# Patient Record
Sex: Male | Born: 1935 | Race: White | Hispanic: No | Marital: Married | Smoking: Former smoker
Health system: Southern US, Community
[De-identification: ages and names within clinical notes are randomized; demographics above are authoritative.]

## PROBLEM LIST (undated history)

## (undated) DIAGNOSIS — I2510 Atherosclerotic heart disease of native coronary artery without angina pectoris: Secondary | ICD-10-CM

## (undated) DIAGNOSIS — M1990 Unspecified osteoarthritis, unspecified site: Secondary | ICD-10-CM

## (undated) DIAGNOSIS — G4730 Sleep apnea, unspecified: Secondary | ICD-10-CM

## (undated) DIAGNOSIS — I499 Cardiac arrhythmia, unspecified: Secondary | ICD-10-CM

## (undated) HISTORY — PX: HERNIA REPAIR: SHX51

---

## 1969-04-03 HISTORY — PX: BACK SURGERY: SHX140

## 1978-12-03 HISTORY — PX: EYE SURGERY: SHX253

## 1997-11-21 ENCOUNTER — Observation Stay (HOSPITAL_COMMUNITY): Admission: EM | Admit: 1997-11-21 | Discharge: 1997-11-22 | Payer: Self-pay | Admitting: Emergency Medicine

## 1997-12-01 ENCOUNTER — Emergency Department (HOSPITAL_COMMUNITY): Admission: EM | Admit: 1997-12-01 | Discharge: 1997-12-01 | Payer: Self-pay | Admitting: Emergency Medicine

## 2002-04-02 ENCOUNTER — Encounter: Admission: RE | Admit: 2002-04-02 | Discharge: 2002-04-02 | Payer: Self-pay | Admitting: Orthopedic Surgery

## 2002-04-02 ENCOUNTER — Encounter: Payer: Self-pay | Admitting: Orthopedic Surgery

## 2003-06-12 ENCOUNTER — Ambulatory Visit (HOSPITAL_COMMUNITY): Admission: RE | Admit: 2003-06-12 | Discharge: 2003-06-12 | Payer: Self-pay | Admitting: Cardiology

## 2003-09-04 ENCOUNTER — Ambulatory Visit (HOSPITAL_COMMUNITY): Admission: RE | Admit: 2003-09-04 | Discharge: 2003-09-04 | Payer: Self-pay | Admitting: Cardiology

## 2003-12-01 ENCOUNTER — Ambulatory Visit (HOSPITAL_COMMUNITY): Admission: RE | Admit: 2003-12-01 | Discharge: 2003-12-01 | Payer: Self-pay | Admitting: Cardiology

## 2004-07-26 ENCOUNTER — Encounter (INDEPENDENT_AMBULATORY_CARE_PROVIDER_SITE_OTHER): Payer: Self-pay | Admitting: *Deleted

## 2004-07-26 ENCOUNTER — Ambulatory Visit (HOSPITAL_COMMUNITY): Admission: RE | Admit: 2004-07-26 | Discharge: 2004-07-26 | Payer: Self-pay | Admitting: Gastroenterology

## 2007-07-12 ENCOUNTER — Encounter: Admission: RE | Admit: 2007-07-12 | Discharge: 2007-07-12 | Payer: Self-pay | Admitting: Internal Medicine

## 2010-08-19 NOTE — Op Note (Signed)
[name], [name]                       ACCOUNT NO.:  [id number]   MEDICAL RECORD NO.:  [id number]                   PATIENT TYPE:  OIB   LOCATION:  2899                                 FACILITY:  [facility]   PHYSICIAN:  Francisca December, M.D.               DATE OF BIRTH:  Oct 09, 1932   DATE OF PROCEDURE:  09/04/2003  DATE OF DISCHARGE:  09/04/2003                                 OPERATIVE REPORT   PROCEDURE PERFORMED:  Direct current elective cardioversion.   INDICATIONS FOR PROCEDURE:  Travis May is a 76 year old man with  atrial fibrillation thought secondary to sleep apnea.  This has been  recently diagnosed.  He has been on CPAP now for approximately six weeks.  He has already undergone one elective cardioversion that was initially  successful but subsequently, the patient reverted to atrial fibrillation.  He is brought back for a repeat procedure at this time after prolonged  treatment with Flecainide and establishing adequate therapy for his sleep  apnea.   PROCEDURE NOTE:  The patient underwent a direct current cardioversion  following the IV administration of 200 mg IV Pentothal administered by Dr.  Gypsy Balsam of the anesthesia department.  After establishing deep anesthesia and  while monitoring heart rate, blood pressure, O2 saturation, and ECG, the  patient received a single transthoracic biphasic dose of energy at 150  joules synchronized.  This resulted in prompt return to sinus rhythm.   IMPRESSION:  Successful elective cardioversion of atrial fibrillation to  sinus rhythm.   PLAN:  The patient's INR was only 1.7 two days ago.  He has not had a recent  PT/INR since Aug 18, 2003.  He has been on 2.5, 2.5, 5 mg of Warfarin in the  recent past.  Will increase his Warfarin to 5 mg p.o. daily and check a  PT/INR in three days.  Will continue Flecainide at 50 mg p.o. b.i.d.  Plan  on seeing the patient in 2-3 weeks in the office and will discontinue  Warfarin if he  maintains sinus rhythm for 4-6 weeks.                                               Francisca December, M.D.    JHE/MEDQ  D:  09/04/2003  T:  09/04/2003  Job:  [id number]   cc:   Theressa Millard, M.D.  [address] [place]  [place] [address]  Fax: [phone]

## 2010-08-19 NOTE — Op Note (Signed)
Travis May, Travis May                       ACCOUNT NO.:  [id number]   MEDICAL RECORD NO.:  [id number]                   PATIENT TYPE:  OIB   LOCATION:  2858                                 FACILITY:  [facility]   PHYSICIAN:  Francisca December, M.D.               DATE OF BIRTH:  [birth year]   DATE OF PROCEDURE:  06/12/2003  DATE OF DISCHARGE:                                 OPERATIVE REPORT   PROCEDURE:  Elective cardioversion.   INDICATIONS:  Mr. Umstead is a 77 year old man with the recent onset of  atrial fibrillation.  No clear structural heart disease has been identified.  An exercise Cardiolite was within normal limits.  Left ventricular function  is normal.  This procedure is undertaken to restore sinus rhythm.  He has  been anticoagulated now for six weeks therapeutically and has been initiated  on p.o. flecainide 50 mg p.o. b.i.d.   DESCRIPTION OF PROCEDURE:  While monitoring heart rate, blood pressure, O2  saturation, and EKG, and under the supervision of Dr. Reuel Boom __________,  anesthesia department, the patient was administered 200 mg of IV penthothal.  Following establishment of deep anesthesia, the patient received a 150 joule  biphasic synchronized dose of transthoracic energy which resulted in prompt  return to sinus rhythm.   The patient is currently awakening with no evidence of untoward sequelae.  An EKG is pending.   IMPRESSION:  Successful elective cardioversion of atrial fibrillation to  sinus rhythm.   PLAN:  Will continue warfarin times four weeks and flecainide indefinitely.  Plan for office visit followup in two to three weeks.                                               Francisca December, M.D.    JHE/MEDQ  D:  06/12/2003  T:  06/14/2003  Job:  [id number]   cc:   Ike Bene, M.D.  [address]  [place]  [place] [address]  Fax: [phone]

## 2010-08-19 NOTE — Op Note (Signed)
NAME:  Travis May, Travis May                     ACCOUNT NO.:  [id number]   MEDICAL RECORD NO.:  [id number]                   PATIENT TYPE:  OIB   LOCATION:  2899                                 FACILITY:  [facility]   PHYSICIAN:  Francisca December, M.D.               DATE OF BIRTH:  1932/12/12   DATE OF PROCEDURE:  12/01/2003  DATE OF DISCHARGE:  12/01/2003                                 OPERATIVE REPORT   PROCEDURE PERFORMED:  Direct current cardioversion.   INDICATIONS:  Mr. Wojnarowski is a 77 year old man with apparent lone atrial  fibrillation.  He has undergone cardioversion twice in March and June of  this year with subsequent recurrence of atrial fibrillation.  He is  minimally symptomatic.  He has been loaded now on p.o. amiodarone and has  greater than 12 g of drug administered over the past four to six weeks.  He  is to undergo his third and last electrocardioversion at this time in an  attempt to maintain sinus rhythm and avoid long-term warfarin therapy.   ANESTHESIA:  By Dr. Felicie Morn, 180 mg of Pentothal administered.   DESCRIPTION OF PROCEDURE:  While monitoring heart rate, blood pressure, O2  saturation, and ECG, and after deep anesthesia obtained by Dr. Katrinka Blazing of the  anesthesia department, the patient was received a single transthoracic dose  of direct current energy, 150 joules biphasic synchronized.  This resulted  in primary return of sinus rhythm.   The patient is currently awakening without evidence of sequelae.   IMPRESSION:  Successful elective cardioversion, atrial fibrillation to sinus  rhythm.   PLAN:  1.  Continue amiodarone 400 mg p.o. daily until next office visit.  2.  Continue warfarin with therapeutic INR of 2-3 for another four weeks.  3.  Will decrease dose of amiodarone at time of his next office which should      be three to four weeks from now.                                               Francisca December, M.D.    JHE/MEDQ  D:  12/01/2003   T:  12/01/2003  Job:  [id number]

## 2012-07-09 ENCOUNTER — Other Ambulatory Visit: Payer: Self-pay | Admitting: Gastroenterology

## 2012-08-12 ENCOUNTER — Encounter (HOSPITAL_COMMUNITY): Payer: Self-pay | Admitting: *Deleted

## 2012-08-20 ENCOUNTER — Encounter (HOSPITAL_COMMUNITY): Payer: Self-pay | Admitting: Pharmacy Technician

## 2012-08-22 NOTE — Progress Notes (Signed)
lov note dr Jocelyn Lamer cardiology 08-20-2012 on chart ekg 08-20-2012 dr Jocelyn Lamer on chart

## 2012-09-03 ENCOUNTER — Encounter (HOSPITAL_COMMUNITY): Payer: Self-pay | Admitting: Anesthesiology

## 2012-09-03 ENCOUNTER — Encounter (HOSPITAL_COMMUNITY): Payer: Self-pay | Admitting: *Deleted

## 2012-09-03 ENCOUNTER — Ambulatory Visit (HOSPITAL_COMMUNITY): Payer: Medicare Other | Admitting: Anesthesiology

## 2012-09-03 ENCOUNTER — Ambulatory Visit (HOSPITAL_COMMUNITY)
Admission: RE | Admit: 2012-09-03 | Discharge: 2012-09-03 | Disposition: A | Discharge status: Home / Self Care | Payer: Medicare Other | Source: Ambulatory Visit | Attending: Gastroenterology | Admitting: Gastroenterology

## 2012-09-03 ENCOUNTER — Encounter (HOSPITAL_COMMUNITY)
Admission: RE | Disposition: A | Discharge status: Home / Self Care | Payer: Self-pay | Source: Ambulatory Visit | Attending: Gastroenterology

## 2012-09-03 DIAGNOSIS — Z860100 Personal history of colon polyps, unspecified: Secondary | ICD-10-CM | POA: Insufficient documentation

## 2012-09-03 DIAGNOSIS — Z1211 Encounter for screening for malignant neoplasm of colon: Secondary | ICD-10-CM | POA: Insufficient documentation

## 2012-09-03 DIAGNOSIS — I4891 Unspecified atrial fibrillation: Secondary | ICD-10-CM | POA: Insufficient documentation

## 2012-09-03 DIAGNOSIS — Z79899 Other long term (current) drug therapy: Secondary | ICD-10-CM | POA: Insufficient documentation

## 2012-09-03 DIAGNOSIS — G4733 Obstructive sleep apnea (adult) (pediatric): Secondary | ICD-10-CM | POA: Insufficient documentation

## 2012-09-03 DIAGNOSIS — D126 Benign neoplasm of colon, unspecified: Secondary | ICD-10-CM | POA: Insufficient documentation

## 2012-09-03 HISTORY — PX: COLONOSCOPY WITH PROPOFOL: SHX5780

## 2012-09-03 HISTORY — DX: Sleep apnea, unspecified: G47.30

## 2012-09-03 HISTORY — DX: Atherosclerotic heart disease of native coronary artery without angina pectoris: I25.10

## 2012-09-03 HISTORY — DX: Unspecified osteoarthritis, unspecified site: M19.90

## 2012-09-03 HISTORY — DX: Cardiac arrhythmia, unspecified: I49.9

## 2012-09-03 SURGERY — COLONOSCOPY WITH PROPOFOL
Anesthesia: Monitor Anesthesia Care

## 2012-09-03 MED ORDER — PROMETHAZINE HCL 25 MG/ML IJ SOLN: 6.2500 mg | INTRAMUSCULAR | Status: DC | PRN

## 2012-09-03 MED ORDER — MIDAZOLAM HCL 5 MG/5ML IJ SOLN
INTRAMUSCULAR | Status: DC | PRN
  Administered 2012-09-03: 1 mg via INTRAVENOUS

## 2012-09-03 MED ORDER — KETAMINE HCL 10 MG/ML IJ SOLN
INTRAMUSCULAR | Status: DC | PRN
  Administered 2012-09-03: 12 mg via INTRAVENOUS
  Administered 2012-09-03: 13 mg via INTRAVENOUS

## 2012-09-03 MED ORDER — SODIUM CHLORIDE 0.9 % IV SOLN: INTRAVENOUS | Status: DC

## 2012-09-03 MED ORDER — LACTATED RINGERS IV SOLN
INTRAVENOUS | Status: DC
  Administered 2012-09-03: 1000 mL via INTRAVENOUS

## 2012-09-03 MED ORDER — PROPOFOL INFUSION 10 MG/ML OPTIME
INTRAVENOUS | Status: DC | PRN
  Administered 2012-09-03: 75 ug/kg/min via INTRAVENOUS

## 2012-09-03 SURGICAL SUPPLY — 21 items

## 2012-09-03 NOTE — H&P (Signed)
  Procedure: Surveillance colonoscopy  History: The patient is a 79 year old male born 26-Jun-1932. The patient chronically takes Coumadin to treat chronic atrial fibrillation and has obstructive sleep apnea syndrome.  In 2006, adenomatous colon polyps were removed colonoscopically. In 2009, the patient underwent a normal surveillance colonoscopy.  The patient is scheduled to undergo a repeat surveillance colonoscopy today.  Chronic medications: Coumadin. Diltiazem. Simvastatin.  Past medical and surgical history: Herniorrhaphies. Lumbar laminectomy. Left fifth finger fracture surgery. Basal cell skin cancer removed from the forehead. Situational adjustment reaction. Allergic rhinitis. Hypercholesterolemia. Chronic atrial fibrillation. Obstructive sleep apnea syndrome.  Habits: The patient quit smoking cigarettes in 1965.  Medication allergies: Sulfa  Exam: The patient is alert and lying comfortably on the endoscopy stretcher. Abdomen is soft, flat, and nontender to palpation. Cardiac exam reveals a regular rhythm although the patient carries a diagnosis of chronic atrial fibrillation. Lungs are clear to auscultation.  Plan: Proceed with surveillance colonoscopy using propofol sedation.

## 2012-09-03 NOTE — Op Note (Signed)
Procedure: Surveillance proctocolonoscopy to the descending colon.  Endoscopist: Danise Edge  Premedication: Propofol administered by anesthesia  Procedure: The patient was placed in the left lateral decubitus position. Anal inspection and digital rectal exam were normal. The Pentax pediatric colonoscope was introduced into the rectum and advanced to approximately the proximal descending colon. Due to left colonic loop formation which could not be controlled by applying external abdominal pressure and repositioning the patient from the left lateral decubitus position to the supine position and finally the right lateral decubitus position a complete colonoscopy could not be performed. Colonic preparation for the exam today was good.  Rectum. Normal. Retroflexed view of the distal rectum could not be performed.  Sigmoid colon. Normal.  Descending colon. From the proximal descending colon a 5 mm sessile polyp was removed with the cold snare.  The splenic flexure, transverse colon, hepatic flexure, ascending colon, cecum, and ileocecal valve were not examined  Assessment:  #1. Incomplete surveillance colonoscopy to the proximal descending colon.  #2. A 5 mm sessile polyp was removed from the descending colon.  Recommendations: Schedule air contrast barium enema in approximately 3 weeks.

## 2012-09-03 NOTE — Anesthesia Postprocedure Evaluation (Signed)
Anesthesia Post Note  Patient: Travis May  Procedure(s) Performed: Procedure(s) (LRB): COLONOSCOPY WITH PROPOFOL (N/A)  Anesthesia type: MAC  Patient location: PACU  Post pain: Pain level controlled  Post assessment: Post-op Vital signs reviewed  Last Vitals:  Filed Vitals:   09/03/12 1203  BP: 117/90  Pulse:   Temp:   Resp: 17    Post vital signs: Reviewed  Level of consciousness: sedated  Complications: No apparent anesthesia complications

## 2012-09-03 NOTE — Transfer of Care (Signed)
Immediate Anesthesia Transfer of Care Note  Patient: Travis May  Procedure(s) Performed: Procedure(s) (LRB): COLONOSCOPY WITH PROPOFOL (N/A)  Patient Location: PACU  Anesthesia Type: MAC  Level of Consciousness: sedated, patient cooperative and responds to stimulaton  Airway & Oxygen Therapy: Patient Spontanous Breathing and Patient connected to face mask oxgen  Post-op Assessment: Report given to PACU RN and Post -op Vital signs reviewed and stable  Post vital signs: Reviewed and stable  Complications: No apparent anesthesia complications

## 2012-09-03 NOTE — Anesthesia Preprocedure Evaluation (Addendum)
Anesthesia Evaluation  Patient identified by MRN, date of birth, ID band Patient awake    Reviewed: Allergy & Precautions, H&P , NPO status , Patient's Chart, lab work & pertinent test results  Airway Mallampati: II TM Distance: >3 FB Neck ROM: Full    Dental  (+) Partial Upper, Partial Lower, Caps and Dental Advisory Given   Pulmonary sleep apnea and Continuous Positive Airway Pressure Ventilation , former smoker,  breath sounds clear to auscultation  Pulmonary exam normal       Cardiovascular + CAD + dysrhythmias Atrial Fibrillation Rhythm:Regular Rate:Normal     Neuro/Psych negative neurological ROS  negative psych ROS   GI/Hepatic negative GI ROS, Neg liver ROS,   Endo/Other  negative endocrine ROS  Renal/GU negative Renal ROS  negative genitourinary   Musculoskeletal negative musculoskeletal ROS (+)   Abdominal   Peds  Hematology negative hematology ROS (+)   Anesthesia Other Findings   Reproductive/Obstetrics                          Anesthesia Physical Anesthesia Plan  ASA: III  Anesthesia Plan: MAC   Post-op Pain Management:    Induction: Intravenous  Airway Management Planned: Simple Face Mask  Additional Equipment:   Intra-op Plan:   Post-operative Plan:   Informed Consent: I have reviewed the patients History and Physical, chart, labs and discussed the procedure including the risks, benefits and alternatives for the proposed anesthesia with the patient or authorized representative who has indicated his/her understanding and acceptance.   Dental advisory given  Plan Discussed with: CRNA  Anesthesia Plan Comments:         Anesthesia Quick Evaluation

## 2012-09-03 NOTE — Preoperative (Signed)
Beta Blockers   Reason not to administer Beta Blockers:Not Applicable 

## 2012-09-04 ENCOUNTER — Encounter (HOSPITAL_COMMUNITY): Payer: Self-pay | Admitting: Gastroenterology

## 2012-09-10 ENCOUNTER — Other Ambulatory Visit: Payer: Self-pay | Admitting: Gastroenterology

## 2012-09-10 DIAGNOSIS — Z86010 Personal history of colonic polyps: Secondary | ICD-10-CM

## 2012-09-19 ENCOUNTER — Ambulatory Visit
Admission: RE | Admit: 2012-09-19 | Discharge: 2012-09-19 | Disposition: A | Discharge status: Home / Self Care | Payer: Medicare Other | Source: Ambulatory Visit | Attending: Gastroenterology | Admitting: Gastroenterology

## 2012-09-19 DIAGNOSIS — Z86010 Personal history of colonic polyps: Secondary | ICD-10-CM

## 2013-01-21 ENCOUNTER — Encounter (INDEPENDENT_AMBULATORY_CARE_PROVIDER_SITE_OTHER): Payer: Self-pay

## 2013-01-21 ENCOUNTER — Ambulatory Visit (INDEPENDENT_AMBULATORY_CARE_PROVIDER_SITE_OTHER): Payer: Medicare Other | Admitting: Pharmacist

## 2013-01-21 DIAGNOSIS — I4891 Unspecified atrial fibrillation: Secondary | ICD-10-CM

## 2013-02-12 ENCOUNTER — Other Ambulatory Visit: Payer: Self-pay | Admitting: Nurse Practitioner

## 2013-02-12 ENCOUNTER — Ambulatory Visit
Admission: RE | Admit: 2013-02-12 | Discharge: 2013-02-12 | Disposition: A | Discharge status: Home / Self Care | Payer: Medicare Other | Source: Ambulatory Visit | Attending: Nurse Practitioner | Admitting: Nurse Practitioner

## 2013-02-12 DIAGNOSIS — R52 Pain, unspecified: Secondary | ICD-10-CM

## 2013-03-04 ENCOUNTER — Encounter (INDEPENDENT_AMBULATORY_CARE_PROVIDER_SITE_OTHER): Payer: Self-pay

## 2013-03-04 ENCOUNTER — Ambulatory Visit (INDEPENDENT_AMBULATORY_CARE_PROVIDER_SITE_OTHER): Payer: Medicare Other | Admitting: *Deleted

## 2013-03-04 DIAGNOSIS — I4891 Unspecified atrial fibrillation: Secondary | ICD-10-CM

## 2013-04-15 ENCOUNTER — Ambulatory Visit (INDEPENDENT_AMBULATORY_CARE_PROVIDER_SITE_OTHER): Payer: Medicare Other | Admitting: *Deleted

## 2013-04-15 DIAGNOSIS — I4891 Unspecified atrial fibrillation: Secondary | ICD-10-CM

## 2013-04-15 LAB — POCT INR: INR: 2.6

## 2013-05-27 ENCOUNTER — Ambulatory Visit (INDEPENDENT_AMBULATORY_CARE_PROVIDER_SITE_OTHER): Payer: Medicare Other | Admitting: Pharmacist

## 2013-05-27 DIAGNOSIS — I4891 Unspecified atrial fibrillation: Secondary | ICD-10-CM

## 2013-05-27 LAB — POCT INR: INR: 1.7

## 2013-06-17 ENCOUNTER — Ambulatory Visit (INDEPENDENT_AMBULATORY_CARE_PROVIDER_SITE_OTHER): Payer: Medicare Other

## 2013-06-17 DIAGNOSIS — I4891 Unspecified atrial fibrillation: Secondary | ICD-10-CM

## 2013-06-17 LAB — POCT INR: INR: 2.5

## 2013-07-09 ENCOUNTER — Encounter: Payer: Self-pay | Admitting: Interventional Cardiology

## 2013-07-15 ENCOUNTER — Ambulatory Visit (INDEPENDENT_AMBULATORY_CARE_PROVIDER_SITE_OTHER): Payer: Medicare Other | Admitting: *Deleted

## 2013-07-15 DIAGNOSIS — I4891 Unspecified atrial fibrillation: Secondary | ICD-10-CM

## 2013-07-15 LAB — POCT INR: INR: 2.7

## 2013-07-28 ENCOUNTER — Other Ambulatory Visit: Payer: Self-pay | Admitting: Interventional Cardiology

## 2013-08-19 ENCOUNTER — Ambulatory Visit: Payer: Medicare Other | Admitting: Interventional Cardiology

## 2013-08-26 ENCOUNTER — Encounter: Payer: Self-pay | Admitting: Interventional Cardiology

## 2013-08-26 ENCOUNTER — Ambulatory Visit (INDEPENDENT_AMBULATORY_CARE_PROVIDER_SITE_OTHER): Payer: Medicare Other | Admitting: Pharmacist Clinician (PhC)/ Clinical Pharmacy Specialist

## 2013-08-26 ENCOUNTER — Encounter (INDEPENDENT_AMBULATORY_CARE_PROVIDER_SITE_OTHER): Payer: Self-pay

## 2013-08-26 ENCOUNTER — Ambulatory Visit (INDEPENDENT_AMBULATORY_CARE_PROVIDER_SITE_OTHER): Payer: Medicare Other | Admitting: Interventional Cardiology

## 2013-08-26 VITALS — BP 120/76 | HR 83 | Ht 71.0 in | Wt 237.5 lb

## 2013-08-26 DIAGNOSIS — E782 Mixed hyperlipidemia: Secondary | ICD-10-CM | POA: Insufficient documentation

## 2013-08-26 DIAGNOSIS — I4891 Unspecified atrial fibrillation: Secondary | ICD-10-CM

## 2013-08-26 DIAGNOSIS — I359 Nonrheumatic aortic valve disorder, unspecified: Secondary | ICD-10-CM | POA: Insufficient documentation

## 2013-08-26 LAB — POCT INR: INR: 2.7

## 2013-08-26 NOTE — Patient Instructions (Signed)
Your physician recommends that you continue on your current medications as directed. Please refer to the Current Medication list given to you today.  Your physician wants you to follow-up in: 1 year with Dr. Varanasi. You will receive a reminder letter in the mail two months in advance. If you don't receive a letter, please call our office to schedule the follow-up appointment.  

## 2013-08-26 NOTE — Progress Notes (Signed)
Patient ID: ORA MCNATT, male   DOB: 10-09-1932, 80 y.o.   MRN: [id number]    [facility], [place], [place]  [address] Phone: [phone] Fax:  [phone]  Date:  08/26/2013   ID:  Tenoch, Mcclure October 17, 1932, MRN [id number]  PCP:  Horton Finer, MD      History of Present Illness: DONTARIO EVETTS is a 80 y.o. male who has chronic AFIb. He exercises regularly, 3x/week. He goes for several hours at a time in the summer. He does 30 minutes on the elliptical; then weights and walks on the track. He also works with a Clinical research associate. No bleeding problems.  Denies : Chest pain.  Dyspnea on exertion.  Nitroglycerin.  Orthopnea.  Palpitations.  Syncope.  Feet Delane Ginger are sore. Limits his walking. No chest pain or SOB with exercise. SOme edema at the end of the day.    Wt Readings from Last 3 Encounters:  08/26/13 237 lb 8 oz (107.729 kg)  09/03/12 235 lb (106.595 kg)  09/03/12 235 lb (106.595 kg)     Past Medical History  Diagnosis Date  . Coronary artery disease   . Dysrhythmia     Atrial Fibrillation  . Sleep apnea     CPAP  . Arthritis     Current Outpatient Prescriptions  Medication Sig Dispense Refill  . b complex vitamins tablet Take 1 tablet by mouth daily.      . cholecalciferol (VITAMIN D) 1000 UNITS tablet Take 1,000 Units by mouth daily before breakfast.      . diltiazem (DILACOR XR) 240 MG 24 hr capsule TAKE 1 CAPSULE BY MOUTH EVERY MORNING ON AN EMPTY STOMACH  30 capsule  1  . diltiazem (TIAZAC) 240 MG 24 hr capsule Take 240 mg by mouth daily. May take early am -2 am or 3 am      . simvastatin (ZOCOR) 10 MG tablet Take 10 mg by mouth daily before breakfast.      . sodium chloride (OCEAN) 0.65 % nasal spray Place 1 spray into the nose daily as needed for congestion.      Marland Kitchen warfarin (COUMADIN) 5 MG tablet Take 5 mg by mouth daily. Takes 1 full tablet on Mondays and Fridays and 1/2 tablet on Tuesday, Wednesday, Thursday, Saturday and  Sunday       No current facility-administered medications for this visit.    Allergies:    Allergies  Allergen Reactions  . Sulfur     whelps    Social History:  The patient  reports that he quit smoking about 50 years ago. He does not have any smokeless tobacco history on file. He reports that he drinks alcohol. He reports that he does not use illicit drugs.   Family History:  The patient's family history is not on file.   ROS:  Please see the history of present illness.  No nausea, vomiting.  No fevers, chills.  No focal weakness.  No dysuria.   All other systems reviewed and negative.   PHYSICAL EXAM: VS:  BP 120/76  Pulse 83  Ht 5\' 11"  (1.803 m)  Wt 237 lb 8 oz (107.729 kg)  BMI 33.14 kg/m2 Well nourished, well developed, in no acute distress HEENT: normal Neck: no JVD, no carotid bruits Cardiac:  normal S1, S2; irregularly irregular Lungs:  clear to auscultation bilaterally, no wheezing, rhonchi or rales Abd: soft, nontender, no hepatomegaly Ext: no edema Skin: warm and dry Neuro:   no  focal abnormalities noted  EKG:  AFib, rate controlled    ASSESSMENT AND PLAN:  Atrial fibrillation  IMAGING: EKG   Harward,Amy 08/20/2012 08:49:07 AM > Willys Salvino,JAY 08/20/2012 09:18:17 AM > AFib, rate controlled.  IMAGING: 2D Echocardiogram (Ordered for 08/2012)-mild AI, mild Aortic sclerosis; normal LV function Notes:  Rate controlled. COumadin for stroke prevention.  2. Anticoagulant monitoring  Notes: INR To be checked Coumadin dose to be adjusted with Pharm.D. Please see his note for details.  3. Hypercholesteremia, pure  Continue Simvastatin Tablet, 10 MG, TAKE 1 TABLET BY MOUTH DAILY ; LDL 119; TG 227- try to improve diet -less fast food.  Consider fish oil 2 gr BID.   4. Aortic valve disorders  Notes: Mild AI on prior echocardiogram. blood pressure controlled.    Signed, Mina Marble, MD, [facility] 08/26/2013 3:03 PM

## 2013-09-26 ENCOUNTER — Other Ambulatory Visit: Payer: Self-pay | Admitting: Interventional Cardiology

## 2013-10-07 ENCOUNTER — Ambulatory Visit (INDEPENDENT_AMBULATORY_CARE_PROVIDER_SITE_OTHER): Payer: Medicare Other | Admitting: *Deleted

## 2013-10-07 DIAGNOSIS — I4891 Unspecified atrial fibrillation: Secondary | ICD-10-CM

## 2013-10-07 LAB — POCT INR: INR: 2

## 2013-11-19 ENCOUNTER — Ambulatory Visit (INDEPENDENT_AMBULATORY_CARE_PROVIDER_SITE_OTHER): Payer: Medicare Other | Admitting: Surgery

## 2013-11-19 DIAGNOSIS — I4891 Unspecified atrial fibrillation: Secondary | ICD-10-CM

## 2013-11-19 LAB — POCT INR: INR: 2.4

## 2013-12-02 ENCOUNTER — Ambulatory Visit: Payer: Medicare Other | Admitting: Podiatry

## 2013-12-09 ENCOUNTER — Encounter: Payer: Self-pay | Admitting: Podiatry

## 2013-12-09 ENCOUNTER — Ambulatory Visit (INDEPENDENT_AMBULATORY_CARE_PROVIDER_SITE_OTHER): Payer: Medicare Other

## 2013-12-09 ENCOUNTER — Ambulatory Visit (INDEPENDENT_AMBULATORY_CARE_PROVIDER_SITE_OTHER): Payer: Medicare Other | Admitting: Podiatry

## 2013-12-09 VITALS — BP 139/81 | HR 81 | Resp 18 | Ht 71.0 in | Wt 235.0 lb

## 2013-12-09 DIAGNOSIS — M19079 Primary osteoarthritis, unspecified ankle and foot: Secondary | ICD-10-CM

## 2013-12-09 DIAGNOSIS — M2040 Other hammer toe(s) (acquired), unspecified foot: Secondary | ICD-10-CM

## 2013-12-09 DIAGNOSIS — M79673 Pain in unspecified foot: Secondary | ICD-10-CM

## 2013-12-09 DIAGNOSIS — M79609 Pain in unspecified limb: Secondary | ICD-10-CM

## 2013-12-09 NOTE — Progress Notes (Signed)
   Subjective:    Patient ID: Travis May, male    DOB: 04-Aug-1932, 80 y.o.   MRN: [id number]  HPI Comments: Trouble walking , my feet just hurt. The right great toe has hurt across the top of it, also across the top of the foot, left foot hurts across the top of . Hurting for 3 or 4 years off and on. Was told to take tylenol by primary doctor   Foot Pain      Review of Systems  HENT:       Sinus problems  Genitourinary:       Increased urination   Musculoskeletal:       Joint pain        Objective:   Physical Exam: I have reviewed his past medical history medications allergies surgeries social history and review of systems. Pulses are strongly palpable bilateral. Normal neurologic sensorium is intact per Semmes-Weinstein monofilament. Deep tendon reflexes are intact bilateral muscle strength is 5 over 5 dorsiflexors plantar flexors inverters everters all intrinsic musculature is intact. Orthopedic evaluation demonstrates pes planus bilateral hammertoe deformities bilateral. Radiographic evaluation demonstrates osteoarthritis of the subtalar joint midtarsal joint of the left foot and it Lisfranc joints and forefoot right. Cutaneous evaluation Mr. is supple while hydrated cutis no erythema edema saline is drainage or odor        Assessment & Plan:  Assessment: Osteoarthritis and pes planus bilateral.  Plan: Scanned for a set of orthotics today.

## 2013-12-31 ENCOUNTER — Ambulatory Visit (INDEPENDENT_AMBULATORY_CARE_PROVIDER_SITE_OTHER): Payer: Medicare Other | Admitting: Pharmacist

## 2013-12-31 DIAGNOSIS — I4891 Unspecified atrial fibrillation: Secondary | ICD-10-CM

## 2013-12-31 LAB — POCT INR: INR: 2.4

## 2014-01-06 ENCOUNTER — Ambulatory Visit: Payer: Medicare Other | Admitting: Podiatry

## 2014-01-06 DIAGNOSIS — M79673 Pain in unspecified foot: Secondary | ICD-10-CM

## 2014-01-06 NOTE — Patient Instructions (Signed)

## 2014-02-13 ENCOUNTER — Ambulatory Visit (INDEPENDENT_AMBULATORY_CARE_PROVIDER_SITE_OTHER): Payer: Medicare Other | Admitting: Pharmacist

## 2014-02-13 DIAGNOSIS — I4891 Unspecified atrial fibrillation: Secondary | ICD-10-CM

## 2014-02-13 LAB — POCT INR: INR: 2.5

## 2014-03-16 ENCOUNTER — Other Ambulatory Visit: Payer: Self-pay | Admitting: Interventional Cardiology

## 2014-03-23 ENCOUNTER — Ambulatory Visit (INDEPENDENT_AMBULATORY_CARE_PROVIDER_SITE_OTHER): Payer: Medicare Other | Admitting: Pharmacist

## 2014-03-23 DIAGNOSIS — I4891 Unspecified atrial fibrillation: Secondary | ICD-10-CM

## 2014-03-23 DIAGNOSIS — Z23 Encounter for immunization: Secondary | ICD-10-CM

## 2014-03-23 LAB — POCT INR: INR: 2.2

## 2014-05-04 ENCOUNTER — Ambulatory Visit (INDEPENDENT_AMBULATORY_CARE_PROVIDER_SITE_OTHER): Payer: Medicare Other | Admitting: *Deleted

## 2014-05-04 DIAGNOSIS — I4891 Unspecified atrial fibrillation: Secondary | ICD-10-CM

## 2014-05-04 LAB — POCT INR: INR: 2.6

## 2014-06-16 ENCOUNTER — Ambulatory Visit (INDEPENDENT_AMBULATORY_CARE_PROVIDER_SITE_OTHER): Payer: Medicare Other | Admitting: *Deleted

## 2014-06-16 DIAGNOSIS — I4891 Unspecified atrial fibrillation: Secondary | ICD-10-CM

## 2014-06-16 LAB — POCT INR: INR: 2.6

## 2014-07-29 ENCOUNTER — Ambulatory Visit (INDEPENDENT_AMBULATORY_CARE_PROVIDER_SITE_OTHER): Payer: Medicare Other | Admitting: *Deleted

## 2014-07-29 DIAGNOSIS — I4891 Unspecified atrial fibrillation: Secondary | ICD-10-CM | POA: Diagnosis not present

## 2014-07-29 LAB — POCT INR: INR: 3.3

## 2014-08-27 ENCOUNTER — Ambulatory Visit (INDEPENDENT_AMBULATORY_CARE_PROVIDER_SITE_OTHER): Payer: Medicare Other | Admitting: *Deleted

## 2014-08-27 ENCOUNTER — Encounter: Payer: Self-pay | Admitting: Interventional Cardiology

## 2014-08-27 ENCOUNTER — Ambulatory Visit (INDEPENDENT_AMBULATORY_CARE_PROVIDER_SITE_OTHER): Payer: Medicare Other | Admitting: Interventional Cardiology

## 2014-08-27 VITALS — BP 130/72 | HR 76 | Ht 71.0 in | Wt 237.0 lb

## 2014-08-27 DIAGNOSIS — D6869 Other thrombophilia: Secondary | ICD-10-CM | POA: Insufficient documentation

## 2014-08-27 DIAGNOSIS — I4891 Unspecified atrial fibrillation: Secondary | ICD-10-CM | POA: Diagnosis not present

## 2014-08-27 DIAGNOSIS — I4819 Other persistent atrial fibrillation: Secondary | ICD-10-CM

## 2014-08-27 DIAGNOSIS — I359 Nonrheumatic aortic valve disorder, unspecified: Secondary | ICD-10-CM | POA: Diagnosis not present

## 2014-08-27 DIAGNOSIS — I481 Persistent atrial fibrillation: Secondary | ICD-10-CM | POA: Diagnosis not present

## 2014-08-27 DIAGNOSIS — Z7901 Long term (current) use of anticoagulants: Secondary | ICD-10-CM

## 2014-08-27 LAB — POCT INR: INR: 2.5

## 2014-08-27 NOTE — Patient Instructions (Signed)
Medication Instructions:  Same-no change  Labwork: None  Testing/Procedures: None  Follow-Up: Your physician wants you to follow-up in: 1 year. You will receive a reminder letter in the mail two months in advance. If you don't receive a letter, please call our office to schedule the follow-up appointment.      

## 2014-08-27 NOTE — Progress Notes (Signed)
Patient ID: Travis May, male   DOB: [birth year], 81 y.o.   MRN: [id number]     Cardiology Office Note   Date:  08/27/2014   ID:  Travis, May [birth year], MRN [id number]  PCP:  Horton Finer, MD    No chief complaint on file. AFib   Wt Readings from Last 3 Encounters:  08/27/14 237 lb (107.502 kg)  12/09/13 235 lb (106.595 kg)  08/26/13 237 lb 8 oz (107.729 kg)       History of Present Illness: Travis May is a 81 y.o. male  who has chronic AFIb. He exercises regularly, 3x/week at the gym. He goes for several hours at a time in the summer. He does 30 minutes on the elliptical; then weights and walks on the track-more eeliptical now than track. He worked with a Clinical research associate, but has not been doing this of late. No bleeding problems.  He gets his BP checked there.  He is told that his BP is good there. Denies : Chest pain.  Dyspnea on exertion.  Orthopnea.  Palpitations.  Syncope.  Feet Delane Ginger are sore. Limits his walking. Wondered if statins were causing this.  No chest pain or SOB with exercise. SOme edema at the end of the day.  He can use a push mower without problems.      Past Medical History  Diagnosis Date  . Coronary artery disease   . Dysrhythmia     Atrial Fibrillation  . Sleep apnea     CPAP  . Arthritis     Past Surgical History  Procedure Laterality Date  . Back surgery  1971    herniated disc  . Eye surgery  1980's    bilateral cataract with lens implants  . Hernia repair  1950,1980    right inguinal x3  . Colonoscopy with propofol N/A 09/03/2012    Procedure: COLONOSCOPY WITH PROPOFOL;  Surgeon: Garlan Fair, MD;  Location: [facility];  Service: Endoscopy;  Laterality: N/A;     Current Outpatient Prescriptions  Medication Sig Dispense Refill  . b complex vitamins tablet Take 1 tablet by mouth daily.    . cholecalciferol (VITAMIN D) 1000 UNITS tablet Take 1,000 Units by mouth daily before breakfast.    . DILT-XR  240 MG 24 hr capsule TAKE ONE CAPSULE BY MOUTH EVERY MORNING ON AN EMPTY STOMACH 30 capsule 5  . diltiazem (TIAZAC) 240 MG 24 hr capsule Take 240 mg by mouth daily. May take early am -2 am or 3 am    . glucosamine-chondroitin 500-400 MG tablet Take 1 tablet by mouth 3 (three) times daily.    . simvastatin (ZOCOR) 10 MG tablet Take 10 mg by mouth daily before breakfast.    . sodium chloride (OCEAN) 0.65 % nasal spray Place 1 spray into the nose daily as needed for congestion.    Marland Kitchen warfarin (COUMADIN) 5 MG tablet Take 5 mg by mouth daily. Takes 1 full tablet on Mondays and Fridays and 1/2 tablet on Tuesday, Wednesday, Thursday, Saturday and Sunday     No current facility-administered medications for this visit.    Allergies:   Sulfur    Social History:  The patient  reports that he quit smoking about 51 years ago. He does not have any smokeless tobacco history on file. He reports that he drinks alcohol. He reports that he does not use illicit drugs.   Family History:  The patient's *family history includes Lung cancer in his father.  ROS:  Please see the history of present illness.   Otherwise, review of systems are positive for joint pain.   All other systems are reviewed and negative.    PHYSICAL EXAM: VS:  BP 130/72 mmHg  Pulse 76  Ht 5\' 11"  (1.803 m)  Wt 237 lb (107.502 kg)  BMI 33.07 kg/m2 , BMI Body mass index is 33.07 kg/(m^2). GEN: Well nourished, well developed, in no acute distress HEENT: normal Neck: no JVD, carotid bruits, or masses Cardiac: Irregularly irregular; no murmurs, rubs, or gallops,no edema  Respiratory:  clear to auscultation bilaterally, normal work of breathing GI: soft, nontender, nondistended, + BS MS: no deformity or atrophy Skin: warm and dry, no rash Neuro:  Strength and sensation are intact Psych: euthymic mood, full affect   EKG:   The ekg ordered today demonstrates AFib, rate controlled   Recent Labs: No results found for requested labs  within last 365 days.   Lipid Panel No results found for: CHOL, TRIG, HDL, CHOLHDL, VLDL, LDLCALC, LDLDIRECT   Other studies Reviewed: Additional studies/ records that were reviewed today with results demonstrating: echo, see below.   ASSESSMENT AND PLAN:  Atrial fibrillation  No bleeding problems on Coumadin.  Checked every 6 weeks.       2D Echocardiogram ( 08/2012)-mild AI, mild Aortic sclerosis; normal LV function Notes: Rate controlled. COumadin for stroke prevention.  2. Anticoagulant monitoring  Notes: INR To be checked Coumadin dose to be adjusted with Pharm.D. Please see his note for details.  INR was 2.5 today. 3. Hypercholesteremia, pure  Continue Simvastatin Tablet, 10 MG, TAKE 1 TABLET BY MOUTH DAILY ; LDL 119; TG 227- try to improve diet -less fast food. Consider fish oil 2 gr BID. Follwed by PMD. 4. Aortic valve disorders  Notes: Mild AI on prior echocardiogram. blood pressure controlled. No dizziness       Stopped working last year because of his feet.  He was a Museum/gallery curator.  Now using orthotics.     Current medicines are reviewed at length with the patient today.  The patient concerns regarding his medicines were addressed.  The following changes have been made:  No change   Labs/ tests ordered today include: INR in 6 weeks No orders of the defined types were placed in this encounter.    Recommend 150 minutes/week of aerobic exercise Low fat, low carb, high fiber diet recommended  Disposition:   FU in 1 year   Teresita Madura., MD  08/27/2014 8:53 AM    [facility] [place], [place], [place]  [address] Phone: [phone]; Fax: [phone]

## 2014-09-16 ENCOUNTER — Other Ambulatory Visit: Payer: Self-pay | Admitting: Interventional Cardiology

## 2014-10-07 ENCOUNTER — Encounter: Payer: Self-pay | Admitting: Interventional Cardiology

## 2014-10-08 ENCOUNTER — Ambulatory Visit (INDEPENDENT_AMBULATORY_CARE_PROVIDER_SITE_OTHER): Payer: Medicare Other | Admitting: Surgery

## 2014-10-08 DIAGNOSIS — I4891 Unspecified atrial fibrillation: Secondary | ICD-10-CM | POA: Diagnosis not present

## 2014-10-08 LAB — POCT INR: INR: 2.4

## 2014-11-18 ENCOUNTER — Ambulatory Visit (INDEPENDENT_AMBULATORY_CARE_PROVIDER_SITE_OTHER): Payer: Medicare Other | Admitting: *Deleted

## 2014-11-18 DIAGNOSIS — I4891 Unspecified atrial fibrillation: Secondary | ICD-10-CM

## 2014-11-18 LAB — POCT INR: INR: 2.8

## 2014-12-30 ENCOUNTER — Ambulatory Visit (INDEPENDENT_AMBULATORY_CARE_PROVIDER_SITE_OTHER): Payer: Medicare Other | Admitting: *Deleted

## 2014-12-30 DIAGNOSIS — I4891 Unspecified atrial fibrillation: Secondary | ICD-10-CM

## 2014-12-30 LAB — POCT INR: INR: 2.2

## 2015-02-07 IMAGING — US US EXTREM LOW*R* LIMITED
1 series · 14 of 22 positions shown · non-contrast
Comparison: None.

CLINICAL DATA: Trauma 1.5 months ago, anterolateral mid thigh
swelling, possible hematoma

EXAM:
RIGHT LOWER EXTREMITY SOFT TISSUE ULTRASOUND LIMITED
TECHNIQUE: Ultrasound examination was performed including evaluation of the
muscles, tendons, joint, and adjacent soft tissues.

[Series 1: us extrem low*right* limited · 0.22mm/px · 14 of 22 slices shown]
[im 1/22]
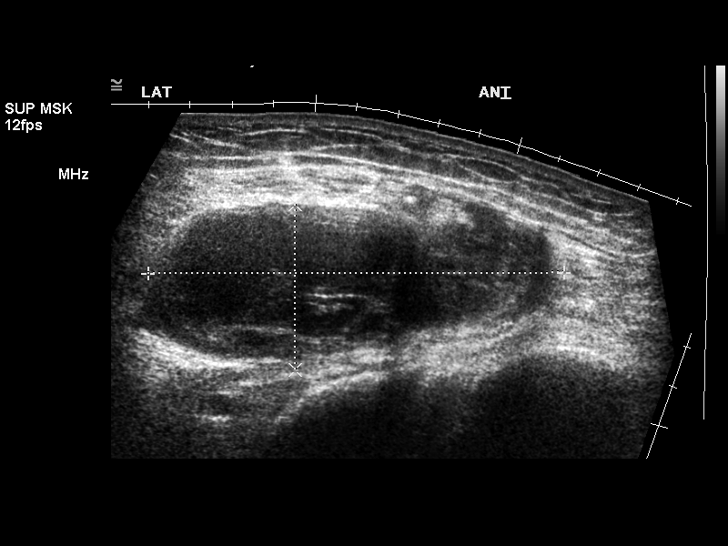
[im 3/22]
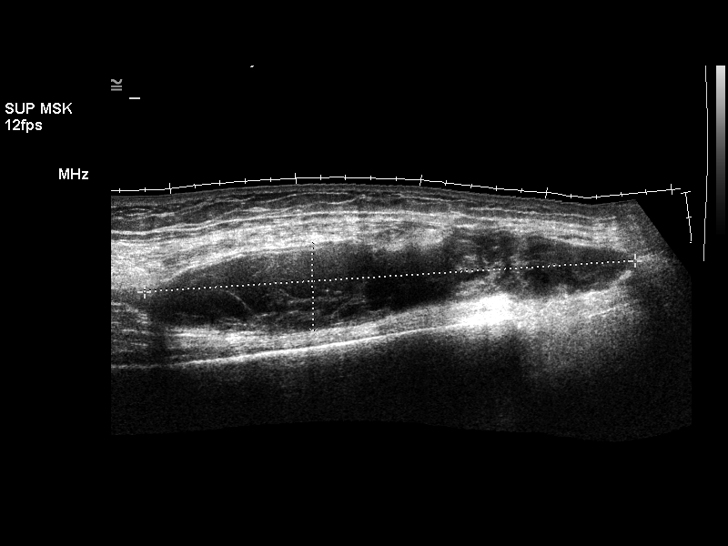
[im 4/22]
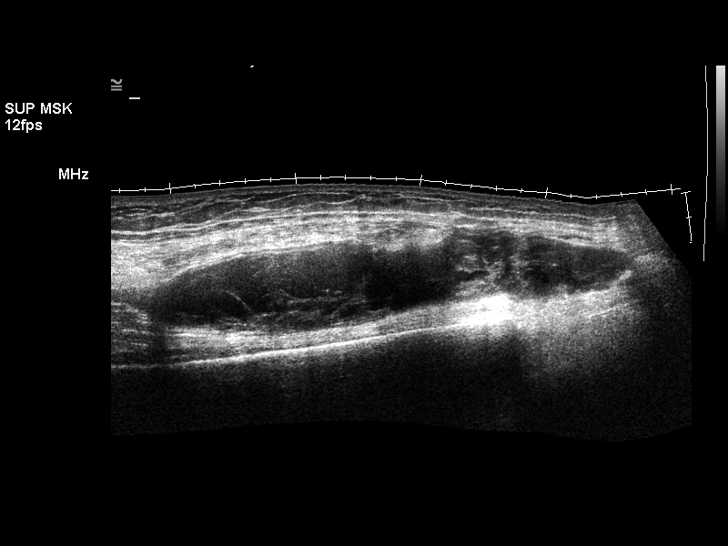
[im 6/22]
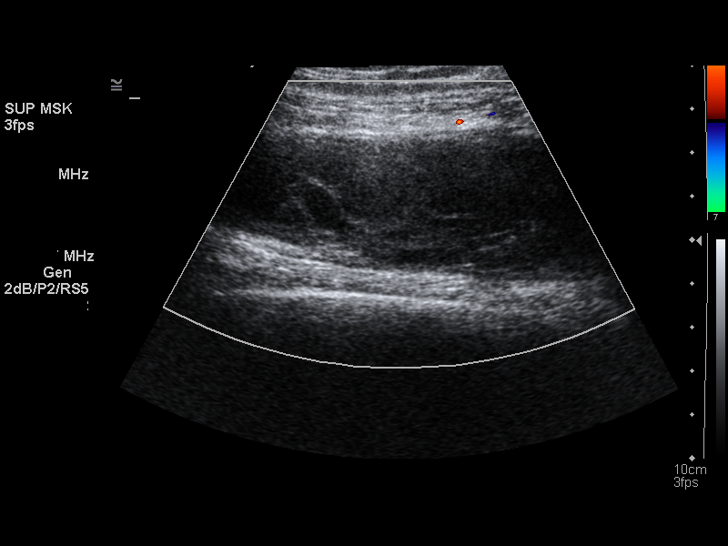
[im 8/22]
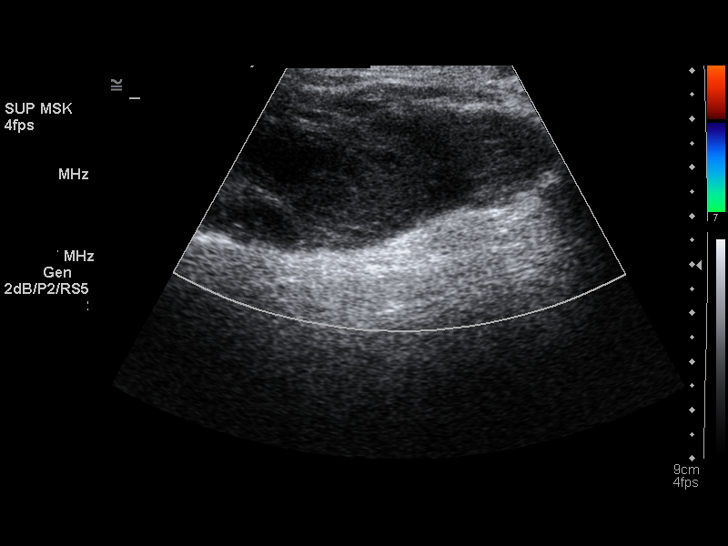
[im 9/22]
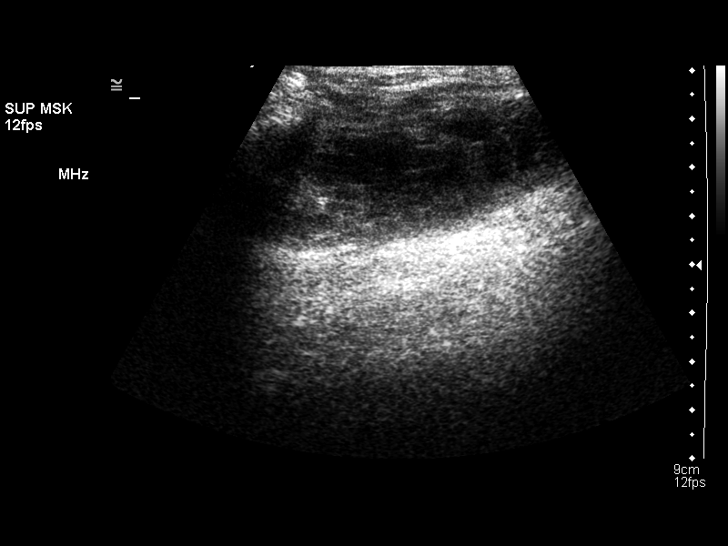
[im 11/22]
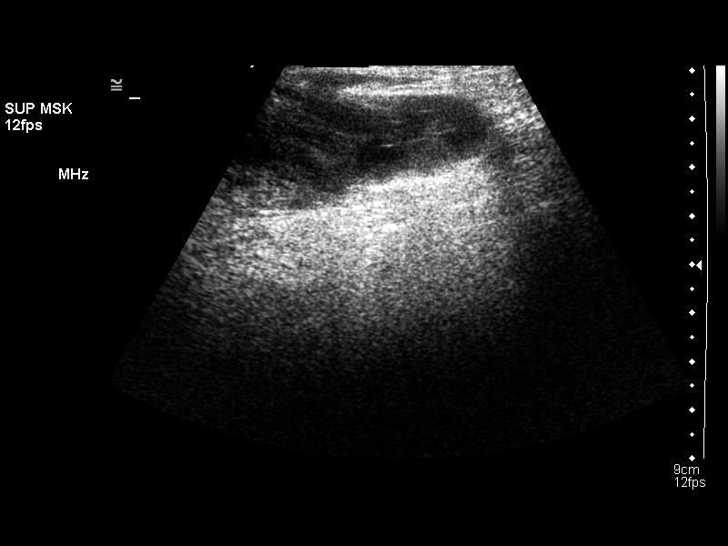
[im 12/22]
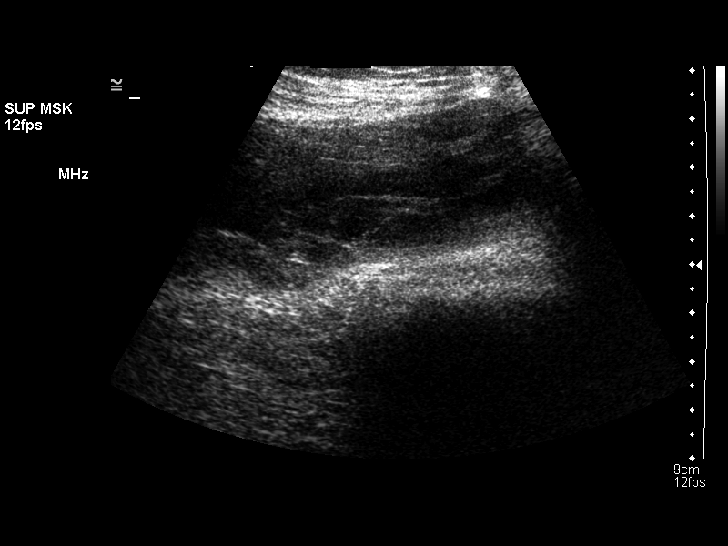
[im 14/22]
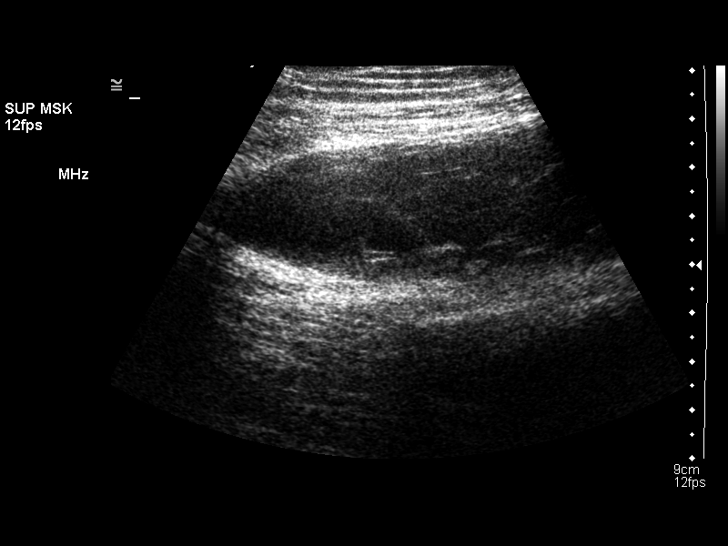
[im 15/22]
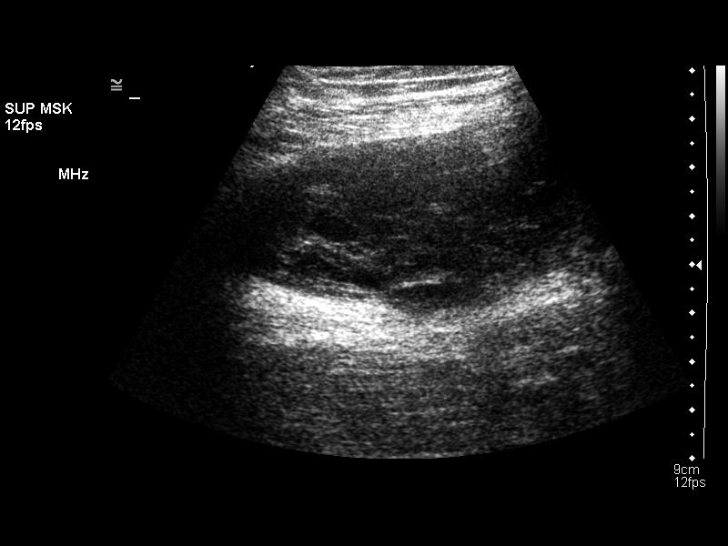
[im 17/22]
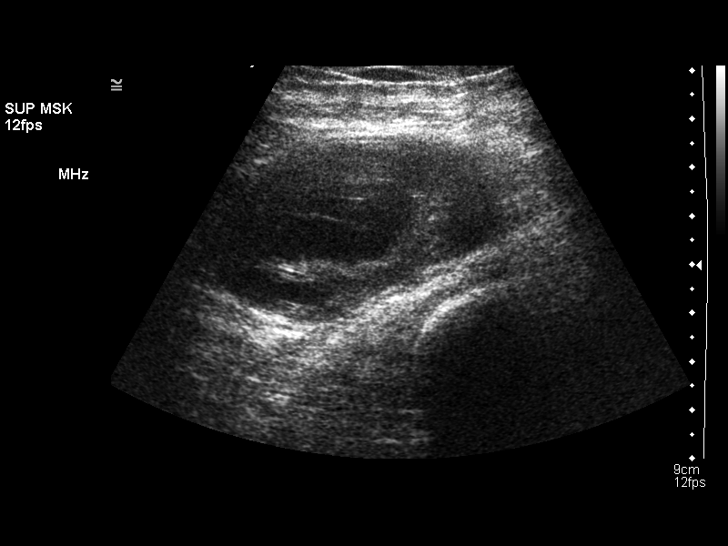
[im 19/22]
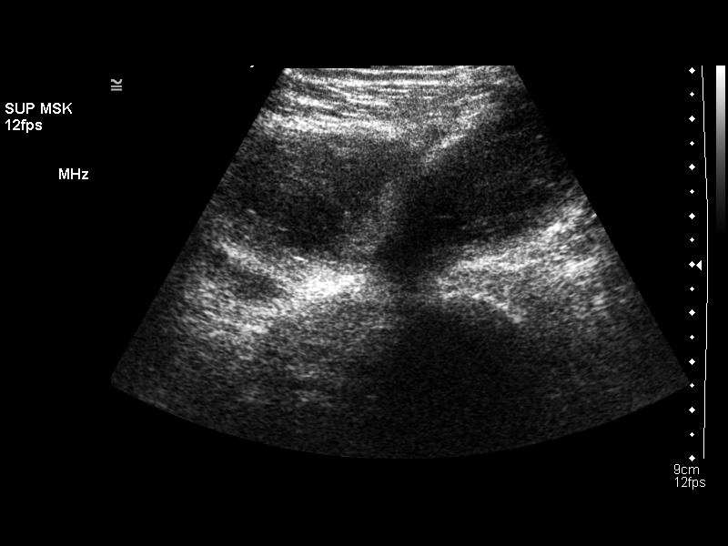
[im 20/22]
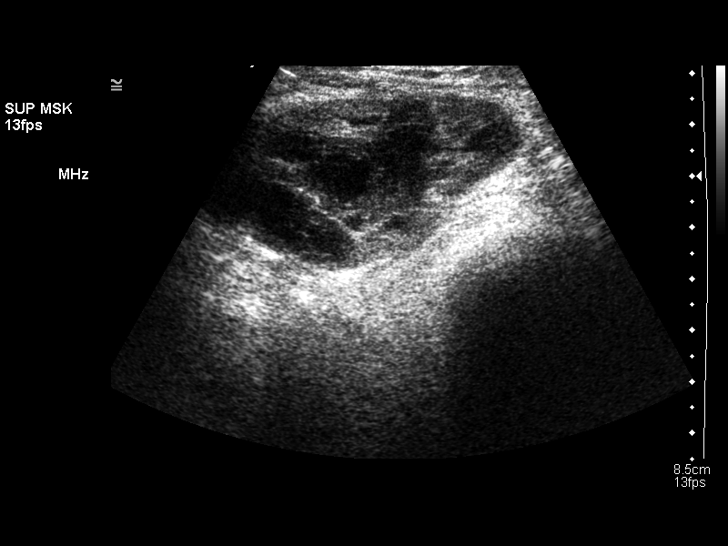
[im 22/22]
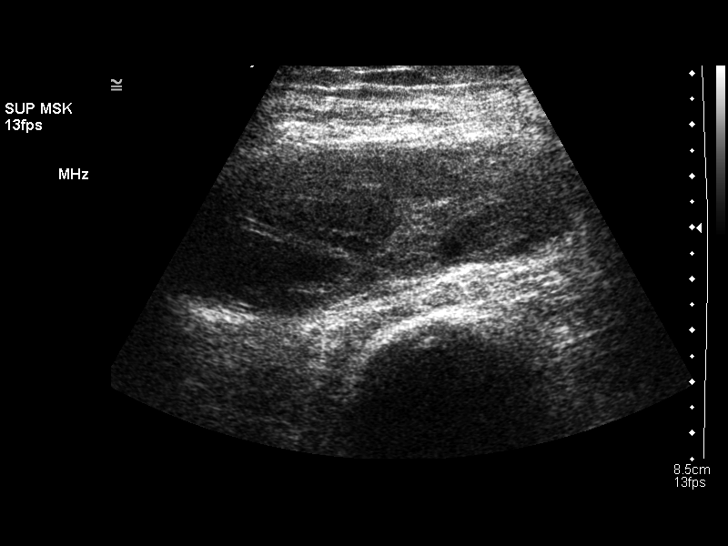

[14 of 22 positions shown; findings below may reference images not displayed]

FINDINGS: Targeted ultrasound was performed in the area of clinical concern
(anterolateral mid thigh).

In the area of clinical concern, there is a 9.9 x 4.0 x 19.5 cm
complex fluid collection without thick wall or associated
vascularity, compatible with a hematoma.
IMPRESSION: 9.9 x 4.0 x 19.5 cm complex fluid collection in the anterolateral
mid thigh, compatible with a hematoma.

## 2015-02-10 ENCOUNTER — Ambulatory Visit (INDEPENDENT_AMBULATORY_CARE_PROVIDER_SITE_OTHER): Payer: Medicare Other | Admitting: Surgery

## 2015-02-10 DIAGNOSIS — Z23 Encounter for immunization: Secondary | ICD-10-CM | POA: Diagnosis not present

## 2015-02-10 DIAGNOSIS — I4891 Unspecified atrial fibrillation: Secondary | ICD-10-CM

## 2015-02-10 LAB — POCT INR: INR: 2.9

## 2015-05-25 DIAGNOSIS — H4089 Other specified glaucoma: Secondary | ICD-10-CM | POA: Diagnosis not present

## 2015-05-25 DIAGNOSIS — T8579XA Infection and inflammatory reaction due to other internal prosthetic devices, implants and grafts, initial encounter: Secondary | ICD-10-CM | POA: Diagnosis not present

## 2015-05-25 DIAGNOSIS — Z961 Presence of intraocular lens: Secondary | ICD-10-CM | POA: Diagnosis not present

## 2015-06-07 DIAGNOSIS — G4733 Obstructive sleep apnea (adult) (pediatric): Secondary | ICD-10-CM | POA: Diagnosis not present

## 2015-06-11 ENCOUNTER — Ambulatory Visit (INDEPENDENT_AMBULATORY_CARE_PROVIDER_SITE_OTHER): Payer: Medicare Other | Admitting: *Deleted

## 2015-06-11 DIAGNOSIS — I4891 Unspecified atrial fibrillation: Secondary | ICD-10-CM

## 2015-06-11 LAB — POCT INR: INR: 2.1

## 2015-06-18 DIAGNOSIS — Z961 Presence of intraocular lens: Secondary | ICD-10-CM | POA: Diagnosis not present

## 2015-06-18 DIAGNOSIS — H2102 Hyphema, left eye: Secondary | ICD-10-CM | POA: Diagnosis not present

## 2015-06-18 DIAGNOSIS — H4042X1 Glaucoma secondary to eye inflammation, left eye, mild stage: Secondary | ICD-10-CM | POA: Diagnosis not present

## 2015-06-18 DIAGNOSIS — H43811 Vitreous degeneration, right eye: Secondary | ICD-10-CM | POA: Diagnosis not present

## 2015-07-23 ENCOUNTER — Ambulatory Visit (INDEPENDENT_AMBULATORY_CARE_PROVIDER_SITE_OTHER): Payer: Medicare Other | Admitting: Surgery

## 2015-07-23 DIAGNOSIS — I4891 Unspecified atrial fibrillation: Secondary | ICD-10-CM | POA: Diagnosis not present

## 2015-07-23 LAB — POCT INR: INR: 2.2

## 2015-08-19 DIAGNOSIS — H52223 Regular astigmatism, bilateral: Secondary | ICD-10-CM | POA: Diagnosis not present

## 2015-08-19 DIAGNOSIS — H1132 Conjunctival hemorrhage, left eye: Secondary | ICD-10-CM | POA: Diagnosis not present

## 2015-08-19 DIAGNOSIS — H40052 Ocular hypertension, left eye: Secondary | ICD-10-CM | POA: Diagnosis not present

## 2015-08-19 DIAGNOSIS — H40051 Ocular hypertension, right eye: Secondary | ICD-10-CM | POA: Diagnosis not present

## 2015-08-19 DIAGNOSIS — H2102 Hyphema, left eye: Secondary | ICD-10-CM | POA: Diagnosis not present

## 2015-08-19 DIAGNOSIS — H5213 Myopia, bilateral: Secondary | ICD-10-CM | POA: Diagnosis not present

## 2015-08-23 DIAGNOSIS — M79672 Pain in left foot: Secondary | ICD-10-CM

## 2015-08-23 DIAGNOSIS — M79671 Pain in right foot: Secondary | ICD-10-CM | POA: Insufficient documentation

## 2015-08-23 NOTE — Progress Notes (Signed)
Patient ID: Travis May, male   DOB: 07/16/1932, 82 y.o.   MRN: [id number]     Cardiology Office Note   Date:  08/24/2015   ID:  Travis May, DOB 06-Feb-1933, MRN [id number]  PCP:  Wenda Low, MD    No chief complaint on file. AFib   Wt Readings from Last 3 Encounters:  08/24/15 237 lb 6.4 oz (107.684 kg)  08/27/14 237 lb (107.502 kg)  12/09/13 235 lb (106.595 kg)       History of Present Illness: Travis May is a 82 y.o. male  who has chronic AFIb. He exercises regularly, 3x/week at the gym- but he is limited by foot pain. He goes for several hours at a time in the summer. He does 30-45 minutes on the elliptical; then weight machines and walks a mile on the track-more elliptical now than track. He worked with a Forensic psychologist, but has not been doing this of late. He will have a new trainer in the fall.  No bleeding problems. He gets his BP checked there. He is told that his BP is good there. Denies : Chest pain.  Dyspnea on exertion.  Orthopnea.  Palpitations.  Syncope.  Feet Delane Ginger are sore. Limits his walking. No chest pain or SOB with exercise. SOme edema at the end of the day. Better is he elevates his legs.  Typically better in the morning. He can use a push mower without problems.   He had a problem with his eyes.  He still drives.   No bleeding problems.  INR checked every 6 weeks.      Past Medical History  Diagnosis Date  . Coronary artery disease   . Dysrhythmia     Atrial Fibrillation  . Sleep apnea     CPAP  . Arthritis     Past Surgical History  Procedure Laterality Date  . Back surgery  1971    herniated disc  . Eye surgery  1980's    bilateral cataract with lens implants  . Hernia repair  1950,1980    right inguinal x3  . Colonoscopy with propofol N/A 09/03/2012    Procedure: COLONOSCOPY WITH PROPOFOL;  Surgeon: Garlan Fair, MD;  Location: [facility];  Service: Endoscopy;  Laterality: N/A;     Current  Outpatient Prescriptions  Medication Sig Dispense Refill  . b complex vitamins tablet Take 1 tablet by mouth daily.    . cholecalciferol (VITAMIN D) 1000 UNITS tablet Take 1,000 Units by mouth daily before breakfast.    . DILT-XR 240 MG 24 hr capsule TAKE 1 CAPSULE BY MOUTH EVERY MORNING ON AN EMPTY STOMACH 30 capsule 11  . glucosamine-chondroitin 500-400 MG tablet Take 1 tablet by mouth 3 (three) times daily.    . simvastatin (ZOCOR) 10 MG tablet Take 10 mg by mouth daily before breakfast.    . sodium chloride (OCEAN) 0.65 % nasal spray Place 1 spray into the nose daily as needed for congestion.    Marland Kitchen warfarin (COUMADIN) 5 MG tablet Take 5 mg by mouth daily. Takes 1 full tablet on Mondays and Fridays and 1/2 tablet on Tuesday, Wednesday, Thursday, Saturday and Sunday     No current facility-administered medications for this visit.    Allergies:   Sulfur and Sulfa antibiotics    Social History:  The patient  reports that he quit smoking about 52 years ago. He does not have any smokeless tobacco history on file. He reports that he drinks alcohol. He reports  that he does not use illicit drugs.   Family History:  The patient's family history includes Lung cancer in his father. There is no history of Heart attack, Stroke, or Hypertension.    ROS:  Please see the history of present illness.   Otherwise, review of systems are positive for joint pains.   All other systems are reviewed and negative.    PHYSICAL EXAM: VS:  BP 130/60 mmHg  Pulse 88  Ht 5\' 11"  (1.803 m)  Wt 237 lb 6.4 oz (107.684 kg)  BMI 33.13 kg/m2 , BMI Body mass index is 33.13 kg/(m^2). GEN: Well nourished, well developed, in no acute distress HEENT: normal Neck: no JVD, carotid bruits, or masses Cardiac: irregularly irregular; no murmurs, rubs, or gallops,no edema  Respiratory:  clear to auscultation bilaterally, normal work of breathing GI: soft, nontender, nondistended, + BS MS: no deformity or atrophy Skin: warm and  dry, no rash Neuro:  Strength and sensation are intact Psych: euthymic mood, full affect   EKG:   The ekg ordered today demonstrates AFib, IRBBB, rate controlled   Recent Labs: No results found for requested labs within last 365 days.   Lipid Panel No results found for: CHOL, TRIG, HDL, CHOLHDL, VLDL, LDLCALC, LDLDIRECT   Other studies Reviewed: Additional studies/ records that were reviewed today with results demonstrating: Normal LV function in 2014, mild AI.   ASSESSMENT AND PLAN:  1. AFib: Rate controlled.  COumadin for stroke prevention.  Continue diltiazem.  BP well controlled.  2. Aortic valve disorders: mild AI in the past.  No CHF sx.  3. Hyperlipidemia: COntinue simvastatin- managed by Dr. Lysle Rubens 4. Anticoagulant monitoring: Continue Coumadin for stroke prevention.  No bleeding problems. The benefits outweigh the risk at this time. 5. Foot pain: Managing this conservatively.  Continue with exercises that don't bother his feet.   Current medicines are reviewed at length with the patient today.  The patient concerns regarding his medicines were addressed.  The following changes have been made:  No change  Labs/ tests ordered today include:   Orders Placed This Encounter  Procedures  . EKG 12-Lead    Recommend 150 minutes/week of aerobic exercise Low fat, low carb, high fiber diet recommended  Disposition:   FU in 1 year   Signed, Larae Grooms, MD  08/24/2015 8:34 AM    [facility] [place], [place], [place]  [address] Phone: [phone]; Fax: [phone]

## 2015-08-24 ENCOUNTER — Ambulatory Visit (INDEPENDENT_AMBULATORY_CARE_PROVIDER_SITE_OTHER): Payer: Medicare Other | Admitting: Interventional Cardiology

## 2015-08-24 ENCOUNTER — Encounter: Payer: Self-pay | Admitting: Interventional Cardiology

## 2015-08-24 VITALS — BP 130/60 | HR 88 | Ht 71.0 in | Wt 237.4 lb

## 2015-08-24 DIAGNOSIS — M79671 Pain in right foot: Secondary | ICD-10-CM

## 2015-08-24 DIAGNOSIS — M79672 Pain in left foot: Secondary | ICD-10-CM

## 2015-08-24 DIAGNOSIS — I481 Persistent atrial fibrillation: Secondary | ICD-10-CM | POA: Diagnosis not present

## 2015-08-24 DIAGNOSIS — I359 Nonrheumatic aortic valve disorder, unspecified: Secondary | ICD-10-CM

## 2015-08-24 DIAGNOSIS — Z7901 Long term (current) use of anticoagulants: Secondary | ICD-10-CM | POA: Diagnosis not present

## 2015-08-24 DIAGNOSIS — E782 Mixed hyperlipidemia: Secondary | ICD-10-CM

## 2015-08-24 DIAGNOSIS — I4819 Other persistent atrial fibrillation: Secondary | ICD-10-CM

## 2015-08-24 NOTE — Patient Instructions (Signed)
**Note De-identified Travis May Obfuscation** Medication Instructions:  Same-no changes  Labwork: None  Testing/Procedures: None  Follow-Up: Your physician wants you to follow-up in: 1 year. You will receive a reminder letter in the mail two months in advance. If you don't receive a letter, please call our office to schedule the follow-up appointment.      If you need a refill on your cardiac medications before your next appointment, please call your pharmacy.   

## 2015-09-03 ENCOUNTER — Ambulatory Visit (INDEPENDENT_AMBULATORY_CARE_PROVIDER_SITE_OTHER): Payer: Medicare Other | Admitting: *Deleted

## 2015-09-03 DIAGNOSIS — I4891 Unspecified atrial fibrillation: Secondary | ICD-10-CM | POA: Diagnosis not present

## 2015-09-03 LAB — POCT INR: INR: 2

## 2015-09-14 ENCOUNTER — Other Ambulatory Visit: Payer: Self-pay

## 2015-09-14 DIAGNOSIS — L821 Other seborrheic keratosis: Secondary | ICD-10-CM | POA: Diagnosis not present

## 2015-09-14 DIAGNOSIS — L570 Actinic keratosis: Secondary | ICD-10-CM | POA: Diagnosis not present

## 2015-09-14 DIAGNOSIS — D1801 Hemangioma of skin and subcutaneous tissue: Secondary | ICD-10-CM | POA: Diagnosis not present

## 2015-09-14 MED ORDER — DILTIAZEM HCL ER 240 MG PO CP24: ORAL_CAPSULE | ORAL | Status: DC

## 2015-09-14 NOTE — Telephone Encounter (Signed)
Jettie Booze, MD at 08/23/2015 8:49 PM  DILT-XR 240 MG 24 hr capsuleTAKE 1 CAPSULE BY MOUTH EVERY MORNING ON AN EMPTY STOMACH ASSESSMENT AND PLAN:  1. AFib: Rate controlled. COumadin for stroke prevention. Continue diltiazem. BP well controlled  Patient Instructions     Medication Instructions:  Same-no changes

## 2015-10-19 DIAGNOSIS — G4733 Obstructive sleep apnea (adult) (pediatric): Secondary | ICD-10-CM | POA: Diagnosis not present

## 2015-10-19 DIAGNOSIS — M1990 Unspecified osteoarthritis, unspecified site: Secondary | ICD-10-CM | POA: Diagnosis not present

## 2015-10-19 DIAGNOSIS — Z1389 Encounter for screening for other disorder: Secondary | ICD-10-CM | POA: Diagnosis not present

## 2015-10-19 DIAGNOSIS — E669 Obesity, unspecified: Secondary | ICD-10-CM | POA: Diagnosis not present

## 2015-10-19 DIAGNOSIS — R7303 Prediabetes: Secondary | ICD-10-CM | POA: Diagnosis not present

## 2015-10-19 DIAGNOSIS — Z0000 Encounter for general adult medical examination without abnormal findings: Secondary | ICD-10-CM | POA: Diagnosis not present

## 2015-10-19 DIAGNOSIS — Z6833 Body mass index (BMI) 33.0-33.9, adult: Secondary | ICD-10-CM | POA: Diagnosis not present

## 2015-10-19 DIAGNOSIS — I482 Chronic atrial fibrillation: Secondary | ICD-10-CM | POA: Diagnosis not present

## 2015-10-19 DIAGNOSIS — E7800 Pure hypercholesterolemia, unspecified: Secondary | ICD-10-CM | POA: Diagnosis not present

## 2015-12-07 ENCOUNTER — Ambulatory Visit (INDEPENDENT_AMBULATORY_CARE_PROVIDER_SITE_OTHER): Payer: Medicare Other

## 2015-12-07 DIAGNOSIS — I4891 Unspecified atrial fibrillation: Secondary | ICD-10-CM | POA: Diagnosis not present

## 2015-12-07 LAB — POCT INR: INR: 1.9

## 2016-01-17 ENCOUNTER — Ambulatory Visit (INDEPENDENT_AMBULATORY_CARE_PROVIDER_SITE_OTHER): Payer: Medicare Other | Admitting: Pharmacist

## 2016-01-17 DIAGNOSIS — Z23 Encounter for immunization: Secondary | ICD-10-CM

## 2016-01-17 DIAGNOSIS — I4891 Unspecified atrial fibrillation: Secondary | ICD-10-CM | POA: Diagnosis not present

## 2016-01-17 LAB — POCT INR: INR: 2.4

## 2016-02-03 DIAGNOSIS — M545 Low back pain: Secondary | ICD-10-CM | POA: Diagnosis not present

## 2016-02-03 DIAGNOSIS — M9903 Segmental and somatic dysfunction of lumbar region: Secondary | ICD-10-CM | POA: Diagnosis not present

## 2016-02-07 DIAGNOSIS — M9903 Segmental and somatic dysfunction of lumbar region: Secondary | ICD-10-CM | POA: Diagnosis not present

## 2016-02-07 DIAGNOSIS — M545 Low back pain: Secondary | ICD-10-CM | POA: Diagnosis not present

## 2016-02-08 DIAGNOSIS — M9903 Segmental and somatic dysfunction of lumbar region: Secondary | ICD-10-CM | POA: Diagnosis not present

## 2016-02-08 DIAGNOSIS — M545 Low back pain: Secondary | ICD-10-CM | POA: Diagnosis not present

## 2016-02-15 DIAGNOSIS — M545 Low back pain: Secondary | ICD-10-CM | POA: Diagnosis not present

## 2016-02-15 DIAGNOSIS — M9903 Segmental and somatic dysfunction of lumbar region: Secondary | ICD-10-CM | POA: Diagnosis not present

## 2016-02-16 DIAGNOSIS — M9903 Segmental and somatic dysfunction of lumbar region: Secondary | ICD-10-CM | POA: Diagnosis not present

## 2016-02-16 DIAGNOSIS — M545 Low back pain: Secondary | ICD-10-CM | POA: Diagnosis not present

## 2016-02-23 DIAGNOSIS — M545 Low back pain: Secondary | ICD-10-CM | POA: Diagnosis not present

## 2016-02-23 DIAGNOSIS — M9903 Segmental and somatic dysfunction of lumbar region: Secondary | ICD-10-CM | POA: Diagnosis not present

## 2016-02-28 ENCOUNTER — Ambulatory Visit (INDEPENDENT_AMBULATORY_CARE_PROVIDER_SITE_OTHER): Payer: Medicare Other | Admitting: Pharmacist

## 2016-02-28 DIAGNOSIS — M9903 Segmental and somatic dysfunction of lumbar region: Secondary | ICD-10-CM | POA: Diagnosis not present

## 2016-02-28 DIAGNOSIS — I4891 Unspecified atrial fibrillation: Secondary | ICD-10-CM | POA: Diagnosis not present

## 2016-02-28 DIAGNOSIS — M545 Low back pain: Secondary | ICD-10-CM | POA: Diagnosis not present

## 2016-02-28 LAB — POCT INR: INR: 2.6

## 2016-02-29 DIAGNOSIS — M9903 Segmental and somatic dysfunction of lumbar region: Secondary | ICD-10-CM | POA: Diagnosis not present

## 2016-02-29 DIAGNOSIS — M545 Low back pain: Secondary | ICD-10-CM | POA: Diagnosis not present

## 2016-03-06 DIAGNOSIS — M9903 Segmental and somatic dysfunction of lumbar region: Secondary | ICD-10-CM | POA: Diagnosis not present

## 2016-03-06 DIAGNOSIS — M545 Low back pain: Secondary | ICD-10-CM | POA: Diagnosis not present

## 2016-03-08 DIAGNOSIS — M545 Low back pain: Secondary | ICD-10-CM | POA: Diagnosis not present

## 2016-03-08 DIAGNOSIS — M9903 Segmental and somatic dysfunction of lumbar region: Secondary | ICD-10-CM | POA: Diagnosis not present

## 2016-03-13 DIAGNOSIS — M545 Low back pain: Secondary | ICD-10-CM | POA: Diagnosis not present

## 2016-03-13 DIAGNOSIS — M9903 Segmental and somatic dysfunction of lumbar region: Secondary | ICD-10-CM | POA: Diagnosis not present

## 2016-04-09 ENCOUNTER — Other Ambulatory Visit: Payer: Self-pay | Admitting: Interventional Cardiology

## 2016-04-10 ENCOUNTER — Ambulatory Visit (INDEPENDENT_AMBULATORY_CARE_PROVIDER_SITE_OTHER): Payer: Medicare Other | Admitting: *Deleted

## 2016-04-10 DIAGNOSIS — I4891 Unspecified atrial fibrillation: Secondary | ICD-10-CM | POA: Diagnosis not present

## 2016-04-10 LAB — POCT INR: INR: 2.7

## 2016-05-22 ENCOUNTER — Ambulatory Visit (INDEPENDENT_AMBULATORY_CARE_PROVIDER_SITE_OTHER): Payer: Medicare Other | Admitting: Pharmacist

## 2016-05-22 DIAGNOSIS — I4891 Unspecified atrial fibrillation: Secondary | ICD-10-CM | POA: Diagnosis not present

## 2016-05-22 LAB — POCT INR: INR: 2.2

## 2016-06-29 DIAGNOSIS — G4733 Obstructive sleep apnea (adult) (pediatric): Secondary | ICD-10-CM | POA: Diagnosis not present

## 2016-07-18 DIAGNOSIS — G4733 Obstructive sleep apnea (adult) (pediatric): Secondary | ICD-10-CM | POA: Diagnosis not present

## 2016-07-21 DIAGNOSIS — G4733 Obstructive sleep apnea (adult) (pediatric): Secondary | ICD-10-CM | POA: Diagnosis not present

## 2016-08-02 ENCOUNTER — Ambulatory Visit (INDEPENDENT_AMBULATORY_CARE_PROVIDER_SITE_OTHER): Payer: Medicare Other | Admitting: Pharmacist

## 2016-08-02 DIAGNOSIS — I4891 Unspecified atrial fibrillation: Secondary | ICD-10-CM | POA: Diagnosis not present

## 2016-08-02 LAB — POCT INR: INR: 2.8

## 2016-08-04 DIAGNOSIS — G4733 Obstructive sleep apnea (adult) (pediatric): Secondary | ICD-10-CM | POA: Diagnosis not present

## 2016-08-08 ENCOUNTER — Other Ambulatory Visit: Payer: Self-pay | Admitting: Interventional Cardiology

## 2016-08-08 ENCOUNTER — Encounter: Payer: Self-pay | Admitting: Interventional Cardiology

## 2016-08-17 DIAGNOSIS — G4733 Obstructive sleep apnea (adult) (pediatric): Secondary | ICD-10-CM | POA: Diagnosis not present

## 2016-08-18 DIAGNOSIS — H40053 Ocular hypertension, bilateral: Secondary | ICD-10-CM | POA: Diagnosis not present

## 2016-08-18 DIAGNOSIS — H52223 Regular astigmatism, bilateral: Secondary | ICD-10-CM | POA: Diagnosis not present

## 2016-08-18 DIAGNOSIS — H5213 Myopia, bilateral: Secondary | ICD-10-CM | POA: Diagnosis not present

## 2016-08-18 DIAGNOSIS — H40003 Preglaucoma, unspecified, bilateral: Secondary | ICD-10-CM | POA: Diagnosis not present

## 2016-08-23 ENCOUNTER — Ambulatory Visit: Payer: Medicare Other | Admitting: Interventional Cardiology

## 2016-08-23 NOTE — Progress Notes (Signed)
Patient ID: Travis May, male   DOB: Jan 19, 1933, 83 y.o.   MRN: [id number]     Cardiology Office Note   Date:  08/24/2016   ID:  Travis May, Travis May Jun 21, 1932, MRN [id number]  PCP:  Wenda Low, MD    No chief complaint on file. AFib   Wt Readings from Last 3 Encounters:  08/24/16 238 lb 6.4 oz (108.1 kg)  08/24/15 237 lb 6.4 oz (107.7 kg)  08/27/14 237 lb (107.5 kg)       History of Present Illness: BRAIDAN RICCIARDI is a 83 y.o. male  who has chronic AFIb. He exercises regularly, 3x/week at the gym- but he is limited by foot pain. Hip and knee pain have started.  He still works with a Forensic psychologist at [employer] at [employer] center.  He gets his BP checked there. He is told that his BP is good there.  He got into the program through his church.  Kinesiology students help supervise exercise, for people > 57 y/o.  He had a problem with his eyes.  He still drives. Vision has been stable.   No bleeding problems.  INR checked every 6 weeks.    Denies : Chest pain. Dizziness. Leg edema. Nitroglycerin use. Orthopnea. Palpitations. Paroxysmal nocturnal dyspnea. Shortness of breath. Syncope.  Still exercising at [facility] as noted above.    Past Medical History:  Diagnosis Date  . Arthritis   . Coronary artery disease   . Dysrhythmia    Atrial Fibrillation  . Sleep apnea    CPAP    Past Surgical History:  Procedure Laterality Date  . BACK SURGERY  1971   herniated disc  . COLONOSCOPY WITH PROPOFOL N/A 09/03/2012   Procedure: COLONOSCOPY WITH PROPOFOL;  Surgeon: Garlan Fair, MD;  Location: [facility];  Service: Endoscopy;  Laterality: N/A;  . EYE SURGERY  1980's   bilateral cataract with lens implants  . HERNIA REPAIR  1950,1980   right inguinal x3     Current Outpatient Prescriptions  Medication Sig Dispense Refill  . b complex vitamins tablet Take 1 tablet by mouth daily.    . cholecalciferol (VITAMIN D) 1000 UNITS tablet Take 1,000 Units by  mouth daily before breakfast.    . DILT-XR 240 MG 24 hr capsule TAKE 1 CAPSULE BY MOUTH EVERY MORNING ON AN EMPTY STOMACH 30 capsule 0  . glucosamine-chondroitin 500-400 MG tablet Take 1 tablet by mouth 3 (three) times daily.    . simvastatin (ZOCOR) 10 MG tablet Take 10 mg by mouth daily before breakfast.    . sodium chloride (OCEAN) 0.65 % nasal spray Place 1 spray into the nose daily as needed for congestion.    Marland Kitchen warfarin (COUMADIN) 5 MG tablet Take 5 mg by mouth daily. Takes 1 full tablet on Mondays and Fridays and 1/2 tablet on Tuesday, Wednesday, Thursday, Saturday and Sunday     No current facility-administered medications for this visit.     Allergies:   Sulfur and Sulfa antibiotics    Social History:  The patient  reports that he quit smoking about 53 years ago. He has never used smokeless tobacco. He reports that he drinks alcohol. He reports that he does not use drugs.   Family History:  The patient's family history includes Lung cancer in his father.    ROS:  Please see the history of present illness.   Otherwise, review of systems are positive for joint pains, occasional bruising.   All other systems  are reviewed and negative.    PHYSICAL EXAM: VS:  BP 122/70 (BP Location: Right Arm, Patient Position: Sitting, Cuff Size: Normal)   Pulse 66   Ht 5\' 11"  (1.803 m)   Wt 238 lb 6.4 oz (108.1 kg)   SpO2 94%   BMI 33.25 kg/m  , BMI Body mass index is 33.25 kg/m. GEN: Well nourished, well developed, in no acute distress  HEENT: normal  Neck: no JVD, carotid bruits, or masses Cardiac: irregularly irregular; no murmurs, rubs, or gallops,no edema  Respiratory:  clear to auscultation bilaterally, normal work of breathing GI: soft, nontender, nondistended,  MS: no deformity or atrophy  Skin: warm and dry, no rash Neuro:  Strength and sensation are intact Psych: euthymic mood, full affect    EKG:   The ekg ordered today demonstrates AFib, rSR', rate controlled   Recent  Labs: No results found for requested labs within last 8760 hours.   Lipid Panel No results found for: CHOL, TRIG, HDL, CHOLHDL, VLDL, LDLCALC, LDLDIRECT   Other studies Reviewed: Additional studies/ records that were reviewed today with results demonstrating: Normal LV function in 2014, mild AI.   ASSESSMENT AND PLAN:  1. AFib: Adequate rate control.  COumadin for stroke prevention.    2. Aortic valve disorders: Mild aortic insufficiency in 2014. No CHF symptoms. 3. Hyperlipidemia: Labs checked with PMD. Continue simvastatin 4. Anticoagulant monitoring: Continue Coumadin at this time for stroke prevention. No bleeding issues.    Current medicines are reviewed at length with the patient today.  The patient concerns regarding his medicines were addressed.  The following changes have been made:  No change  Labs/ tests ordered today include:   No orders of the defined types were placed in this encounter.   Recommend 150 minutes/week of aerobic exercise Low fat, low carb, high fiber diet recommended  Disposition:   FU in 1 year   Signed, Larae Grooms, MD  08/24/2016 10:20 AM    [facility] [place], [place], [place]  [address] Phone: [phone]; Fax: [phone]

## 2016-08-24 ENCOUNTER — Encounter (INDEPENDENT_AMBULATORY_CARE_PROVIDER_SITE_OTHER): Payer: Self-pay

## 2016-08-24 ENCOUNTER — Encounter: Payer: Self-pay | Admitting: Interventional Cardiology

## 2016-08-24 ENCOUNTER — Ambulatory Visit (INDEPENDENT_AMBULATORY_CARE_PROVIDER_SITE_OTHER): Payer: Medicare Other | Admitting: *Deleted

## 2016-08-24 ENCOUNTER — Ambulatory Visit (INDEPENDENT_AMBULATORY_CARE_PROVIDER_SITE_OTHER): Payer: Medicare Other | Admitting: Interventional Cardiology

## 2016-08-24 VITALS — BP 122/70 | HR 66 | Ht 71.0 in | Wt 238.4 lb

## 2016-08-24 DIAGNOSIS — Z7901 Long term (current) use of anticoagulants: Secondary | ICD-10-CM

## 2016-08-24 DIAGNOSIS — I481 Persistent atrial fibrillation: Secondary | ICD-10-CM | POA: Diagnosis not present

## 2016-08-24 DIAGNOSIS — I351 Nonrheumatic aortic (valve) insufficiency: Secondary | ICD-10-CM | POA: Diagnosis not present

## 2016-08-24 DIAGNOSIS — E782 Mixed hyperlipidemia: Secondary | ICD-10-CM | POA: Diagnosis not present

## 2016-08-24 DIAGNOSIS — I4891 Unspecified atrial fibrillation: Secondary | ICD-10-CM | POA: Diagnosis not present

## 2016-08-24 DIAGNOSIS — I359 Nonrheumatic aortic valve disorder, unspecified: Secondary | ICD-10-CM

## 2016-08-24 DIAGNOSIS — I4819 Other persistent atrial fibrillation: Secondary | ICD-10-CM

## 2016-08-24 LAB — POCT INR: INR: 2.1

## 2016-08-24 NOTE — Patient Instructions (Signed)

## 2016-09-11 ENCOUNTER — Other Ambulatory Visit: Payer: Self-pay | Admitting: Interventional Cardiology

## 2016-09-11 DIAGNOSIS — G4733 Obstructive sleep apnea (adult) (pediatric): Secondary | ICD-10-CM | POA: Diagnosis not present

## 2016-09-13 DIAGNOSIS — L570 Actinic keratosis: Secondary | ICD-10-CM | POA: Diagnosis not present

## 2016-09-13 DIAGNOSIS — L918 Other hypertrophic disorders of the skin: Secondary | ICD-10-CM | POA: Diagnosis not present

## 2016-09-13 DIAGNOSIS — L821 Other seborrheic keratosis: Secondary | ICD-10-CM | POA: Diagnosis not present

## 2016-09-13 DIAGNOSIS — D225 Melanocytic nevi of trunk: Secondary | ICD-10-CM | POA: Diagnosis not present

## 2016-09-17 DIAGNOSIS — G4733 Obstructive sleep apnea (adult) (pediatric): Secondary | ICD-10-CM | POA: Diagnosis not present

## 2016-10-06 ENCOUNTER — Ambulatory Visit (INDEPENDENT_AMBULATORY_CARE_PROVIDER_SITE_OTHER): Payer: Medicare Other | Admitting: Pharmacist

## 2016-10-06 DIAGNOSIS — I4891 Unspecified atrial fibrillation: Secondary | ICD-10-CM | POA: Diagnosis not present

## 2016-10-06 LAB — POCT INR: INR: 2.9

## 2016-10-17 DIAGNOSIS — G4733 Obstructive sleep apnea (adult) (pediatric): Secondary | ICD-10-CM | POA: Diagnosis not present

## 2016-11-13 DIAGNOSIS — E782 Mixed hyperlipidemia: Secondary | ICD-10-CM | POA: Diagnosis not present

## 2016-11-13 DIAGNOSIS — Z0000 Encounter for general adult medical examination without abnormal findings: Secondary | ICD-10-CM | POA: Diagnosis not present

## 2016-11-13 DIAGNOSIS — G4733 Obstructive sleep apnea (adult) (pediatric): Secondary | ICD-10-CM | POA: Diagnosis not present

## 2016-11-13 DIAGNOSIS — I482 Chronic atrial fibrillation: Secondary | ICD-10-CM | POA: Diagnosis not present

## 2016-11-17 ENCOUNTER — Ambulatory Visit (INDEPENDENT_AMBULATORY_CARE_PROVIDER_SITE_OTHER): Payer: Medicare Other

## 2016-11-17 DIAGNOSIS — I4891 Unspecified atrial fibrillation: Secondary | ICD-10-CM | POA: Diagnosis not present

## 2016-11-17 LAB — POCT INR: INR: 1.6

## 2016-12-08 ENCOUNTER — Ambulatory Visit (INDEPENDENT_AMBULATORY_CARE_PROVIDER_SITE_OTHER): Payer: Medicare Other | Admitting: *Deleted

## 2016-12-08 DIAGNOSIS — Z5181 Encounter for therapeutic drug level monitoring: Secondary | ICD-10-CM

## 2016-12-08 DIAGNOSIS — I4891 Unspecified atrial fibrillation: Secondary | ICD-10-CM

## 2016-12-08 DIAGNOSIS — I4819 Other persistent atrial fibrillation: Secondary | ICD-10-CM

## 2016-12-08 DIAGNOSIS — I481 Persistent atrial fibrillation: Secondary | ICD-10-CM | POA: Diagnosis not present

## 2016-12-08 LAB — POCT INR: INR: 2.1

## 2016-12-25 DIAGNOSIS — H4042X1 Glaucoma secondary to eye inflammation, left eye, mild stage: Secondary | ICD-10-CM | POA: Diagnosis not present

## 2016-12-25 DIAGNOSIS — Z961 Presence of intraocular lens: Secondary | ICD-10-CM | POA: Diagnosis not present

## 2016-12-25 DIAGNOSIS — H2102 Hyphema, left eye: Secondary | ICD-10-CM | POA: Diagnosis not present

## 2016-12-25 DIAGNOSIS — H43811 Vitreous degeneration, right eye: Secondary | ICD-10-CM | POA: Diagnosis not present

## 2017-01-01 DIAGNOSIS — H2102 Hyphema, left eye: Secondary | ICD-10-CM | POA: Diagnosis not present

## 2017-01-01 DIAGNOSIS — H43811 Vitreous degeneration, right eye: Secondary | ICD-10-CM | POA: Diagnosis not present

## 2017-01-01 DIAGNOSIS — Z961 Presence of intraocular lens: Secondary | ICD-10-CM | POA: Diagnosis not present

## 2017-01-01 DIAGNOSIS — H4042X1 Glaucoma secondary to eye inflammation, left eye, mild stage: Secondary | ICD-10-CM | POA: Diagnosis not present

## 2017-01-05 ENCOUNTER — Ambulatory Visit (INDEPENDENT_AMBULATORY_CARE_PROVIDER_SITE_OTHER): Payer: Medicare Other | Admitting: *Deleted

## 2017-01-05 DIAGNOSIS — I4891 Unspecified atrial fibrillation: Secondary | ICD-10-CM

## 2017-01-05 DIAGNOSIS — Z5181 Encounter for therapeutic drug level monitoring: Secondary | ICD-10-CM

## 2017-01-05 DIAGNOSIS — Z23 Encounter for immunization: Secondary | ICD-10-CM

## 2017-01-05 LAB — POCT INR: INR: 2.4

## 2017-01-08 DIAGNOSIS — Z961 Presence of intraocular lens: Secondary | ICD-10-CM | POA: Diagnosis not present

## 2017-01-08 DIAGNOSIS — H43811 Vitreous degeneration, right eye: Secondary | ICD-10-CM | POA: Diagnosis not present

## 2017-01-08 DIAGNOSIS — H2102 Hyphema, left eye: Secondary | ICD-10-CM | POA: Diagnosis not present

## 2017-01-08 DIAGNOSIS — H4042X1 Glaucoma secondary to eye inflammation, left eye, mild stage: Secondary | ICD-10-CM | POA: Diagnosis not present

## 2017-01-22 DIAGNOSIS — H2102 Hyphema, left eye: Secondary | ICD-10-CM | POA: Diagnosis not present

## 2017-01-22 DIAGNOSIS — H43811 Vitreous degeneration, right eye: Secondary | ICD-10-CM | POA: Diagnosis not present

## 2017-01-22 DIAGNOSIS — H4042X1 Glaucoma secondary to eye inflammation, left eye, mild stage: Secondary | ICD-10-CM | POA: Diagnosis not present

## 2017-01-22 DIAGNOSIS — Z961 Presence of intraocular lens: Secondary | ICD-10-CM | POA: Diagnosis not present

## 2017-01-23 DIAGNOSIS — H6092 Unspecified otitis externa, left ear: Secondary | ICD-10-CM | POA: Diagnosis not present

## 2017-02-02 ENCOUNTER — Ambulatory Visit (INDEPENDENT_AMBULATORY_CARE_PROVIDER_SITE_OTHER): Payer: Medicare Other | Admitting: *Deleted

## 2017-02-02 DIAGNOSIS — Z5181 Encounter for therapeutic drug level monitoring: Secondary | ICD-10-CM

## 2017-02-02 DIAGNOSIS — I4891 Unspecified atrial fibrillation: Secondary | ICD-10-CM | POA: Diagnosis not present

## 2017-02-02 LAB — POCT INR: INR: 2.7

## 2017-02-12 DIAGNOSIS — K439 Ventral hernia without obstruction or gangrene: Secondary | ICD-10-CM | POA: Diagnosis not present

## 2017-02-27 DIAGNOSIS — R1900 Intra-abdominal and pelvic swelling, mass and lump, unspecified site: Secondary | ICD-10-CM | POA: Diagnosis not present

## 2017-02-27 DIAGNOSIS — R7309 Other abnormal glucose: Secondary | ICD-10-CM | POA: Diagnosis not present

## 2017-03-16 ENCOUNTER — Ambulatory Visit (INDEPENDENT_AMBULATORY_CARE_PROVIDER_SITE_OTHER): Payer: Medicare Other | Admitting: *Deleted

## 2017-03-16 DIAGNOSIS — I4891 Unspecified atrial fibrillation: Secondary | ICD-10-CM

## 2017-03-16 DIAGNOSIS — Z5181 Encounter for therapeutic drug level monitoring: Secondary | ICD-10-CM

## 2017-03-16 LAB — POCT INR: INR: 2.2

## 2017-03-16 NOTE — Patient Instructions (Signed)
Description   Continue same dosage 1/2 tablet on all days except 1 tablet on Mondays and Fridays. Recheck INR in 6 weeks.  Call if you have any concerns or placed on any new medications [phone].     

## 2017-04-27 ENCOUNTER — Ambulatory Visit (INDEPENDENT_AMBULATORY_CARE_PROVIDER_SITE_OTHER): Payer: Medicare Other | Admitting: *Deleted

## 2017-04-27 ENCOUNTER — Encounter (INDEPENDENT_AMBULATORY_CARE_PROVIDER_SITE_OTHER): Payer: Self-pay

## 2017-04-27 DIAGNOSIS — I4891 Unspecified atrial fibrillation: Secondary | ICD-10-CM

## 2017-04-27 DIAGNOSIS — Z5181 Encounter for therapeutic drug level monitoring: Secondary | ICD-10-CM | POA: Diagnosis not present

## 2017-04-27 LAB — POCT INR: INR: 3.3

## 2017-04-27 NOTE — Patient Instructions (Addendum)
Description   Do not take any Coumadin today then continue same dosage 1/2 tablet on all days except 1 tablet on Mondays and Fridays. Recheck INR in 5 weeks.  Call if you have any concerns or placed on any new medications [phone].

## 2017-05-25 DIAGNOSIS — H43811 Vitreous degeneration, right eye: Secondary | ICD-10-CM | POA: Diagnosis not present

## 2017-05-25 DIAGNOSIS — H2102 Hyphema, left eye: Secondary | ICD-10-CM | POA: Diagnosis not present

## 2017-05-25 DIAGNOSIS — Z961 Presence of intraocular lens: Secondary | ICD-10-CM | POA: Diagnosis not present

## 2017-05-25 DIAGNOSIS — H4042X1 Glaucoma secondary to eye inflammation, left eye, mild stage: Secondary | ICD-10-CM | POA: Diagnosis not present

## 2017-05-28 ENCOUNTER — Other Ambulatory Visit: Payer: Self-pay | Admitting: Interventional Cardiology

## 2017-05-28 MED ORDER — WARFARIN SODIUM 5 MG PO TABS: ORAL_TABLET | ORAL | 3 refills | Status: DC

## 2017-05-28 NOTE — Telephone Encounter (Signed)
Warfarin refill sent to incorrect pharmacy, called the pharmacy &

## 2017-05-28 NOTE — Addendum Note (Signed)
Addended by: SUPPLE, MEGAN E on: 05/28/2017 02:14 PM   Modules accepted: Orders

## 2017-05-28 NOTE — Telephone Encounter (Signed)
Pt calling requesting a refill on Warfarin, sent to [facility] on [address]. Please address

## 2017-05-28 NOTE — Addendum Note (Signed)
Addended by: Derrel Nip B on: 05/28/2017 02:22 PM   Modules accepted: Orders

## 2017-06-08 ENCOUNTER — Ambulatory Visit (INDEPENDENT_AMBULATORY_CARE_PROVIDER_SITE_OTHER): Payer: Medicare Other | Admitting: *Deleted

## 2017-06-08 DIAGNOSIS — Z5181 Encounter for therapeutic drug level monitoring: Secondary | ICD-10-CM

## 2017-06-08 DIAGNOSIS — I4891 Unspecified atrial fibrillation: Secondary | ICD-10-CM | POA: Diagnosis not present

## 2017-06-08 LAB — POCT INR: INR: 2.9

## 2017-06-08 NOTE — Patient Instructions (Signed)
Description   Continue same dosage 1/2 tablet on all days except 1 tablet on Mondays and Fridays. Recheck INR in 6 weeks.  Call if you have any concerns or placed on any new medications [phone].     

## 2017-06-27 DIAGNOSIS — R739 Hyperglycemia, unspecified: Secondary | ICD-10-CM | POA: Diagnosis not present

## 2017-07-06 DIAGNOSIS — H4042X1 Glaucoma secondary to eye inflammation, left eye, mild stage: Secondary | ICD-10-CM | POA: Diagnosis not present

## 2017-07-06 DIAGNOSIS — H43811 Vitreous degeneration, right eye: Secondary | ICD-10-CM | POA: Diagnosis not present

## 2017-07-06 DIAGNOSIS — Z961 Presence of intraocular lens: Secondary | ICD-10-CM | POA: Diagnosis not present

## 2017-07-06 DIAGNOSIS — H2102 Hyphema, left eye: Secondary | ICD-10-CM | POA: Diagnosis not present

## 2017-07-12 DIAGNOSIS — Z85828 Personal history of other malignant neoplasm of skin: Secondary | ICD-10-CM | POA: Diagnosis not present

## 2017-07-12 DIAGNOSIS — L905 Scar conditions and fibrosis of skin: Secondary | ICD-10-CM | POA: Diagnosis not present

## 2017-07-12 DIAGNOSIS — L570 Actinic keratosis: Secondary | ICD-10-CM | POA: Diagnosis not present

## 2017-07-20 ENCOUNTER — Ambulatory Visit: Payer: Medicare Other

## 2017-07-20 DIAGNOSIS — I4891 Unspecified atrial fibrillation: Secondary | ICD-10-CM

## 2017-07-20 DIAGNOSIS — Z5181 Encounter for therapeutic drug level monitoring: Secondary | ICD-10-CM | POA: Diagnosis not present

## 2017-07-20 LAB — POCT INR: INR: 2.8

## 2017-07-20 NOTE — Patient Instructions (Signed)
Description   Continue same dosage 1/2 tablet on all days except 1 tablet on Mondays and Fridays. Recheck INR in 6 weeks.  Call if you have any concerns or placed on any new medications [phone].     

## 2017-08-31 ENCOUNTER — Ambulatory Visit (INDEPENDENT_AMBULATORY_CARE_PROVIDER_SITE_OTHER): Payer: Medicare Other | Admitting: *Deleted

## 2017-08-31 DIAGNOSIS — Z5181 Encounter for therapeutic drug level monitoring: Secondary | ICD-10-CM | POA: Diagnosis not present

## 2017-08-31 DIAGNOSIS — I4891 Unspecified atrial fibrillation: Secondary | ICD-10-CM

## 2017-08-31 LAB — POCT INR: INR: 2.4 (ref 2.0–3.0)

## 2017-08-31 NOTE — Patient Instructions (Signed)
Description   Continue same dosage 1/2 tablet on all days except 1 tablet on Mondays and Fridays. Recheck INR in 6 weeks.  Call if you have any concerns or placed on any new medications [phone].     

## 2017-09-03 NOTE — Progress Notes (Signed)
Cardiology Office Note   Date:  09/04/2017   ID:  Travis May, DOB 13-Dec-1932, MRN [id number]  PCP:  Travis Low, MD    No chief complaint on file.  AFib  Wt Readings from Last 3 Encounters:  09/04/17 226 lb (102.5 kg)  08/24/16 238 lb 6.4 oz (108.1 kg)  08/24/15 237 lb 6.4 oz (107.7 kg)       History of Present Illness: Travis May is a 84 y.o. male  who has chronic AFIb. He exercises regularly, 3x/week at the gym- but he is limited by foot pain. Hip and knee pain have started.  He still works with a Forensic psychologist at [employer] at [employer] center.  He gets his BP checked there. He is told that his BP is good there.  He got into the program through his church.  Kinesiology students help supervise exercise, for people > 70 y/o.  He had a problem with his eyes.  He still drives. Vision has been stable.   No bleeding problems.  INR checked every 6 weeks.    Elliptical bothers his knees and hips.  Denies : Chest pain. Dizziness. Leg edema. Nitroglycerin use. Orthopnea. Palpitations. Paroxysmal nocturnal dyspnea. Shortness of breath. Syncope.      Past Medical History:  Diagnosis Date  . Arthritis   . Coronary artery disease   . Dysrhythmia    Atrial Fibrillation  . Sleep apnea    CPAP    Past Surgical History:  Procedure Laterality Date  . BACK SURGERY  1971   herniated disc  . COLONOSCOPY WITH PROPOFOL N/A 09/03/2012   Procedure: COLONOSCOPY WITH PROPOFOL;  Surgeon: Travis Fair, MD;  Location: [facility];  Service: Endoscopy;  Laterality: N/A;  . EYE SURGERY  1980's   bilateral cataract with lens implants  . HERNIA REPAIR  1950,1980   right inguinal x3     Current Outpatient Medications  Medication Sig Dispense Refill  . b complex vitamins tablet Take 1 tablet by mouth daily.    . cholecalciferol (VITAMIN D) 1000 UNITS tablet Take 1,000 Units by mouth daily before breakfast.    . DILT-XR 240 MG 24 hr capsule TAKE 1 CAPSULE  BY MOUTH EVERY MORNING ON AN EMPTY STOMACH 30 capsule 11  . glucosamine-chondroitin 500-400 MG tablet Take 1 tablet by mouth 3 (three) times daily.    Marland Kitchen latanoprost (XALATAN) 0.005 % ophthalmic solution Place 1 drop into both eyes at bedtime.  10  . Magnesium 250 MG TABS Take 1 tablet by mouth daily.    . simvastatin (ZOCOR) 10 MG tablet Take 10 mg by mouth daily before breakfast.    . sodium chloride (OCEAN) 0.65 % nasal spray Place 1 spray into the nose daily as needed for congestion.    . Turmeric 450 MG CAPS Take by mouth daily.    Marland Kitchen warfarin (COUMADIN) 5 MG tablet Take 1/2 to 1 tablet daily as directed by [facility] 30 tablet 3   No current facility-administered medications for this visit.     Allergies:   Sulfur and Sulfa antibiotics    Social History:  The patient  reports that he quit smoking about 54 years ago. He has never used smokeless tobacco. He reports that he drinks alcohol. He reports that he does not use drugs.   Family History:  The patient's family history includes Lung cancer in his father.    ROS:  Please see the history of present illness.   Otherwise,  review of systems are positive for rare pedal edema; arthritis pain.   All other systems are reviewed and negative.    PHYSICAL EXAM: VS:  BP 112/84   Pulse 73   Ht 5\' 11"  (1.803 m)   Wt 226 lb (102.5 kg)   SpO2 96%   BMI 31.52 kg/m  , BMI Body mass index is 31.52 kg/m. GEN: Well nourished, well developed, in no acute distress  HEENT: normal  Neck: no JVD, carotid bruits, or masses Cardiac: irregularly irregular; no murmurs, rubs, or gallops,no edema  Respiratory:  clear to auscultation bilaterally, normal work of breathing GI: soft, nontender, nondistended, + BS MS: no deformity or atrophy  Skin: warm and dry, no rash Neuro:  Strength and sensation are intact Psych: euthymic mood, full affect   EKG:   The ekg ordered today demonstrates AFib, IRBBB, no ST changes   Recent Labs: No results  found for requested labs within last 8760 hours.   Lipid Panel No results found for: CHOL, TRIG, HDL, CHOLHDL, VLDL, LDLCALC, LDLDIRECT   Other studies Reviewed: Additional studies/ records that were reviewed today with results demonstrating: echo reviewed, labs reviewed.   ASSESSMENT AND PLAN:  1. AFib: Controlled. COntinue current Diltiazem dose.  Continue regular exercise.  He has lost weight intentionally.  I congratulated him on this. 2. Mild AI: noted in 2014.  No CHF sx.  He does try to push himself when he exercises.   3. Hyperlipidemia: LDL 83.  COntinue May dose simvastatin.  Dose limited by Diltiazem.  4. Anticoagulated: No bleeding issues.  Hbg 13.5 in 8/18.  To be rechecked in July 2019.    Current medicines are reviewed at length with the patient today.  The patient concerns regarding his medicines were addressed.  The following changes have been made:  No change  Labs/ tests ordered today include:  No orders of the defined types were placed in this encounter.   Recommend 150 minutes/week of aerobic exercise May fat, May carb, high fiber diet recommended  Disposition:   FU in 1 year   Signed, Larae Grooms, MD  09/04/2017 9:58 AM    [facility] [place], [place], [place]  [address] Phone: [phone]; Fax: [phone]

## 2017-09-04 ENCOUNTER — Encounter: Payer: Self-pay | Admitting: Interventional Cardiology

## 2017-09-04 ENCOUNTER — Encounter (INDEPENDENT_AMBULATORY_CARE_PROVIDER_SITE_OTHER): Payer: Self-pay

## 2017-09-04 ENCOUNTER — Ambulatory Visit: Payer: Medicare Other | Admitting: Interventional Cardiology

## 2017-09-04 VITALS — BP 112/84 | HR 73 | Ht 71.0 in | Wt 226.0 lb

## 2017-09-04 DIAGNOSIS — E782 Mixed hyperlipidemia: Secondary | ICD-10-CM

## 2017-09-04 DIAGNOSIS — I4819 Other persistent atrial fibrillation: Secondary | ICD-10-CM

## 2017-09-04 DIAGNOSIS — I359 Nonrheumatic aortic valve disorder, unspecified: Secondary | ICD-10-CM

## 2017-09-04 DIAGNOSIS — I481 Persistent atrial fibrillation: Secondary | ICD-10-CM

## 2017-09-04 DIAGNOSIS — Z7901 Long term (current) use of anticoagulants: Secondary | ICD-10-CM

## 2017-09-04 NOTE — Patient Instructions (Signed)

## 2017-09-10 ENCOUNTER — Other Ambulatory Visit: Payer: Self-pay | Admitting: Interventional Cardiology

## 2017-09-10 DIAGNOSIS — G4733 Obstructive sleep apnea (adult) (pediatric): Secondary | ICD-10-CM | POA: Diagnosis not present

## 2017-09-19 DIAGNOSIS — H5213 Myopia, bilateral: Secondary | ICD-10-CM | POA: Diagnosis not present

## 2017-09-19 DIAGNOSIS — H40013 Open angle with borderline findings, low risk, bilateral: Secondary | ICD-10-CM | POA: Diagnosis not present

## 2017-09-19 DIAGNOSIS — H40053 Ocular hypertension, bilateral: Secondary | ICD-10-CM | POA: Diagnosis not present

## 2017-09-19 DIAGNOSIS — H43393 Other vitreous opacities, bilateral: Secondary | ICD-10-CM | POA: Diagnosis not present

## 2017-09-21 DIAGNOSIS — G4733 Obstructive sleep apnea (adult) (pediatric): Secondary | ICD-10-CM | POA: Diagnosis not present

## 2017-10-15 DIAGNOSIS — L821 Other seborrheic keratosis: Secondary | ICD-10-CM | POA: Diagnosis not present

## 2017-10-15 DIAGNOSIS — Z85828 Personal history of other malignant neoplasm of skin: Secondary | ICD-10-CM | POA: Diagnosis not present

## 2017-10-15 DIAGNOSIS — D225 Melanocytic nevi of trunk: Secondary | ICD-10-CM | POA: Diagnosis not present

## 2017-10-24 ENCOUNTER — Ambulatory Visit (INDEPENDENT_AMBULATORY_CARE_PROVIDER_SITE_OTHER): Payer: Medicare Other | Admitting: Pharmacist

## 2017-10-24 DIAGNOSIS — Z5181 Encounter for therapeutic drug level monitoring: Secondary | ICD-10-CM | POA: Diagnosis not present

## 2017-10-24 LAB — POCT INR: INR: 2.4 (ref 2.0–3.0)

## 2017-10-24 NOTE — Patient Instructions (Signed)
Description   Continue same dosage 1/2 tablet on all days except 1 tablet on Mondays and Fridays. Recheck INR in 6 weeks.  Call if you have any concerns or placed on any new medications [phone].     

## 2017-11-05 DIAGNOSIS — H4042X1 Glaucoma secondary to eye inflammation, left eye, mild stage: Secondary | ICD-10-CM | POA: Diagnosis not present

## 2017-11-05 DIAGNOSIS — H2102 Hyphema, left eye: Secondary | ICD-10-CM | POA: Diagnosis not present

## 2017-11-05 DIAGNOSIS — H43811 Vitreous degeneration, right eye: Secondary | ICD-10-CM | POA: Diagnosis not present

## 2017-11-05 DIAGNOSIS — Z961 Presence of intraocular lens: Secondary | ICD-10-CM | POA: Diagnosis not present

## 2017-11-15 DIAGNOSIS — Z0000 Encounter for general adult medical examination without abnormal findings: Secondary | ICD-10-CM | POA: Diagnosis not present

## 2017-11-15 DIAGNOSIS — E782 Mixed hyperlipidemia: Secondary | ICD-10-CM | POA: Diagnosis not present

## 2017-11-15 DIAGNOSIS — G4733 Obstructive sleep apnea (adult) (pediatric): Secondary | ICD-10-CM | POA: Diagnosis not present

## 2017-11-15 DIAGNOSIS — I482 Chronic atrial fibrillation: Secondary | ICD-10-CM | POA: Diagnosis not present

## 2017-11-15 DIAGNOSIS — M1990 Unspecified osteoarthritis, unspecified site: Secondary | ICD-10-CM | POA: Diagnosis not present

## 2017-12-11 ENCOUNTER — Ambulatory Visit: Payer: Medicare Other | Admitting: Pharmacist

## 2017-12-11 DIAGNOSIS — I4891 Unspecified atrial fibrillation: Secondary | ICD-10-CM

## 2017-12-11 DIAGNOSIS — Z5181 Encounter for therapeutic drug level monitoring: Secondary | ICD-10-CM

## 2017-12-11 LAB — POCT INR: INR: 2.1 (ref 2.0–3.0)

## 2017-12-11 NOTE — Patient Instructions (Signed)
Description   Continue same dosage 1/2 tablet on all days except 1 tablet on Mondays and Fridays. Recheck INR in 6 weeks.  Call if you have any concerns or placed on any new medications [phone].     

## 2018-01-25 ENCOUNTER — Encounter (INDEPENDENT_AMBULATORY_CARE_PROVIDER_SITE_OTHER): Payer: Self-pay

## 2018-01-25 ENCOUNTER — Ambulatory Visit: Payer: Medicare Other | Admitting: Pharmacist

## 2018-01-25 DIAGNOSIS — I4891 Unspecified atrial fibrillation: Secondary | ICD-10-CM

## 2018-01-25 DIAGNOSIS — Z23 Encounter for immunization: Secondary | ICD-10-CM

## 2018-01-25 DIAGNOSIS — Z5181 Encounter for therapeutic drug level monitoring: Secondary | ICD-10-CM | POA: Diagnosis not present

## 2018-01-25 LAB — POCT INR: INR: 2.1 (ref 2.0–3.0)

## 2018-01-25 NOTE — Patient Instructions (Signed)
Description   Continue same dosage 1/2 tablet on all days except 1 tablet on Mondays and Fridays. Recheck INR in 6 weeks.  Call if you have any concerns or placed on any new medications [phone].     

## 2018-02-05 DIAGNOSIS — H4042X1 Glaucoma secondary to eye inflammation, left eye, mild stage: Secondary | ICD-10-CM | POA: Diagnosis not present

## 2018-02-05 DIAGNOSIS — Z961 Presence of intraocular lens: Secondary | ICD-10-CM | POA: Diagnosis not present

## 2018-02-05 DIAGNOSIS — H43811 Vitreous degeneration, right eye: Secondary | ICD-10-CM | POA: Diagnosis not present

## 2018-02-05 DIAGNOSIS — H2102 Hyphema, left eye: Secondary | ICD-10-CM | POA: Diagnosis not present

## 2018-03-20 ENCOUNTER — Ambulatory Visit: Payer: Medicare Other | Admitting: *Deleted

## 2018-03-20 DIAGNOSIS — Z5181 Encounter for therapeutic drug level monitoring: Secondary | ICD-10-CM | POA: Diagnosis not present

## 2018-03-20 DIAGNOSIS — I4891 Unspecified atrial fibrillation: Secondary | ICD-10-CM | POA: Diagnosis not present

## 2018-03-20 LAB — POCT INR: INR: 2.7 (ref 2.0–3.0)

## 2018-03-20 NOTE — Patient Instructions (Signed)
Description   Continue same dosage 1/2 tablet on all days except 1 tablet on Mondays and Fridays. Recheck INR in 6 weeks.  Call if you have any concerns or placed on any new medications [phone].

## 2018-05-01 ENCOUNTER — Ambulatory Visit (INDEPENDENT_AMBULATORY_CARE_PROVIDER_SITE_OTHER): Payer: Medicare Other

## 2018-05-01 DIAGNOSIS — I4891 Unspecified atrial fibrillation: Secondary | ICD-10-CM | POA: Diagnosis not present

## 2018-05-01 DIAGNOSIS — Z5181 Encounter for therapeutic drug level monitoring: Secondary | ICD-10-CM | POA: Diagnosis not present

## 2018-05-01 LAB — POCT INR: INR: 2.2 (ref 2.0–3.0)

## 2018-05-01 NOTE — Patient Instructions (Signed)
Description   Continue same dosage 1/2 tablet on all days except 1 tablet on Mondays and Fridays. Recheck INR in 6 weeks.  Call if you have any concerns or placed on any new medications [phone].

## 2018-05-22 DIAGNOSIS — R7303 Prediabetes: Secondary | ICD-10-CM | POA: Diagnosis not present

## 2018-05-22 DIAGNOSIS — E782 Mixed hyperlipidemia: Secondary | ICD-10-CM | POA: Diagnosis not present

## 2018-05-22 DIAGNOSIS — I482 Chronic atrial fibrillation, unspecified: Secondary | ICD-10-CM | POA: Diagnosis not present

## 2018-05-22 DIAGNOSIS — G4733 Obstructive sleep apnea (adult) (pediatric): Secondary | ICD-10-CM | POA: Diagnosis not present

## 2018-05-30 ENCOUNTER — Other Ambulatory Visit: Payer: Self-pay | Admitting: Interventional Cardiology

## 2018-06-07 DIAGNOSIS — Z961 Presence of intraocular lens: Secondary | ICD-10-CM | POA: Diagnosis not present

## 2018-06-07 DIAGNOSIS — T8579XS Infection and inflammatory reaction due to other internal prosthetic devices, implants and grafts, sequela: Secondary | ICD-10-CM | POA: Diagnosis not present

## 2018-06-07 DIAGNOSIS — H4042X1 Glaucoma secondary to eye inflammation, left eye, mild stage: Secondary | ICD-10-CM | POA: Diagnosis not present

## 2018-06-07 DIAGNOSIS — H43811 Vitreous degeneration, right eye: Secondary | ICD-10-CM | POA: Diagnosis not present

## 2018-06-12 ENCOUNTER — Ambulatory Visit (INDEPENDENT_AMBULATORY_CARE_PROVIDER_SITE_OTHER): Payer: Medicare Other | Admitting: *Deleted

## 2018-06-12 ENCOUNTER — Other Ambulatory Visit: Payer: Self-pay

## 2018-06-12 DIAGNOSIS — I4891 Unspecified atrial fibrillation: Secondary | ICD-10-CM

## 2018-06-12 DIAGNOSIS — Z5181 Encounter for therapeutic drug level monitoring: Secondary | ICD-10-CM | POA: Diagnosis not present

## 2018-06-12 LAB — POCT INR: INR: 2.1 (ref 2.0–3.0)

## 2018-06-12 NOTE — Patient Instructions (Signed)
Description   Continue same dosage 1/2 tablet on all days except 1 tablet on Mondays and Fridays. Recheck INR in 6 weeks.  Call if you have any concerns or placed on any new medications [phone].

## 2018-07-22 ENCOUNTER — Telehealth: Payer: Self-pay

## 2018-07-22 NOTE — Telephone Encounter (Signed)

## 2018-07-24 ENCOUNTER — Ambulatory Visit (INDEPENDENT_AMBULATORY_CARE_PROVIDER_SITE_OTHER): Payer: Medicare Other | Admitting: *Deleted

## 2018-07-24 ENCOUNTER — Other Ambulatory Visit: Payer: Self-pay

## 2018-07-24 DIAGNOSIS — I4891 Unspecified atrial fibrillation: Secondary | ICD-10-CM

## 2018-07-24 DIAGNOSIS — Z5181 Encounter for therapeutic drug level monitoring: Secondary | ICD-10-CM | POA: Diagnosis not present

## 2018-07-24 LAB — POCT INR: INR: 3 (ref 2.0–3.0)

## 2018-08-20 ENCOUNTER — Telehealth: Payer: Self-pay

## 2018-08-20 NOTE — Telephone Encounter (Signed)
Virtual Visit Pre-Appointment Phone Call TELEPHONE CALL NOTE  Travis May has been deemed a candidate for a follow-up tele-health visit to limit community exposure during the Covid-19 pandemic. I spoke with the patient via phone to ensure availability of phone/video source, confirm preferred email & phone number, and discuss instructions and expectations.  I reminded Travis May to be prepared with any vital sign and/or heart rhythm information that could potentially be obtained via home monitoring, at the time of his visit. I reminded Travis May to expect a phone call prior to his visit.  Patient agrees to consent below. Patient will use wife's smartphone for visit [phone]).  Cleon Gustin, RN 08/20/2018 4:24 PM   FULL LENGTH CONSENT FOR TELE-HEALTH VISIT   I hereby voluntarily request, consent and authorize [facility] and its employed or contracted physicians, physician assistants, nurse practitioners or other licensed health care professionals (the Practitioner), to provide me with telemedicine health care services (the "Services") as deemed necessary by the treating Practitioner. I acknowledge and consent to receive the Services by the Practitioner via telemedicine. I understand that the telemedicine visit will involve communicating with the Practitioner through live audiovisual communication technology and the disclosure of certain medical information by electronic transmission. I acknowledge that I have been given the opportunity to request an in-person assessment or other available alternative prior to the telemedicine visit and am voluntarily participating in the telemedicine visit.  I understand that I have the right to withhold or withdraw my consent to the use of telemedicine in the course of my care at any time, without affecting my right to future care or treatment, and that the Practitioner or I may terminate the telemedicine visit at any  time. I understand that I have the right to inspect all information obtained and/or recorded in the course of the telemedicine visit and may receive copies of available information for a reasonable fee.  I understand that some of the potential risks of receiving the Services via telemedicine include:  Marland Kitchen Delay or interruption in medical evaluation due to technological equipment failure or disruption; . Information transmitted may not be sufficient (e.g. poor resolution of images) to allow for appropriate medical decision making by the Practitioner; and/or  . In rare instances, security protocols could fail, causing a breach of personal health information.  Furthermore, I acknowledge that it is my responsibility to provide information about my medical history, conditions and care that is complete and accurate to the best of my ability. I acknowledge that Practitioner's advice, recommendations, and/or decision may be based on factors not within their control, such as incomplete or inaccurate data provided by me or distortions of diagnostic images or specimens that may result from electronic transmissions. I understand that the practice of medicine is not an exact science and that Practitioner makes no warranties or guarantees regarding treatment outcomes. I acknowledge that I will receive a copy of this consent concurrently upon execution via email to the email address I last provided but may also request a printed copy by calling the office of [facility].    I understand that my insurance will be billed for this visit.   I have read or had this consent read to me. . I understand the contents of this consent, which adequately explains the benefits and risks of the Services being provided via telemedicine.  . I have been provided ample opportunity to ask questions regarding this consent and the Services and have had my  questions answered to my satisfaction. . I give my informed consent for the services  to be provided through the use of telemedicine in my medical care  By participating in this telemedicine visit I agree to the above.

## 2018-08-21 ENCOUNTER — Telehealth (INDEPENDENT_AMBULATORY_CARE_PROVIDER_SITE_OTHER): Payer: Medicare Other | Admitting: Physician Assistant

## 2018-08-21 ENCOUNTER — Encounter: Payer: Self-pay | Admitting: Physician Assistant

## 2018-08-21 ENCOUNTER — Other Ambulatory Visit: Payer: Self-pay

## 2018-08-21 VITALS — BP 124/63 | HR 73 | Ht 71.0 in | Wt 212.0 lb

## 2018-08-21 DIAGNOSIS — I4811 Longstanding persistent atrial fibrillation: Secondary | ICD-10-CM | POA: Diagnosis not present

## 2018-08-21 DIAGNOSIS — I351 Nonrheumatic aortic (valve) insufficiency: Secondary | ICD-10-CM

## 2018-08-21 DIAGNOSIS — E785 Hyperlipidemia, unspecified: Secondary | ICD-10-CM

## 2018-08-21 DIAGNOSIS — I340 Nonrheumatic mitral (valve) insufficiency: Secondary | ICD-10-CM

## 2018-08-21 MED ORDER — SIMVASTATIN 10 MG PO TABS: 10.0000 mg | ORAL_TABLET | Freq: Every day | ORAL | 3 refills | Status: DC

## 2018-08-21 MED ORDER — DILTIAZEM HCL ER 240 MG PO CP24: ORAL_CAPSULE | ORAL | 3 refills | Status: DC

## 2018-08-21 NOTE — Patient Instructions (Signed)
Medication Instructions:  Your physician recommends that you continue on your current medications as directed. Please refer to the Current Medication list given to you today. 1. Medications were sent today to requested pharmacy # [id number].  Labwork: -None  Testing/Procedures: -None  Follow-Up: Your physician wants you to follow-up in: 1 year with Dr.Varanasi.  You will receive a reminder letter in the mail two months in advance. If you don't receive a letter, please call our office to schedule the follow-up appointment.   Any Other Special Instructions Will Be Listed Below (If Applicable).     If you need a refill on your cardiac medications before your next appointment, please call your pharmacy.  2

## 2018-08-21 NOTE — Progress Notes (Signed)
Virtual Visit via Video Note   This visit type was conducted due to national recommendations for restrictions regarding the COVID-19 Pandemic (e.g. social distancing) in an effort to limit this patient's exposure and mitigate transmission in our community.  Due to his co-morbid illnesses, this patient is at least at moderate risk for complications without adequate follow up.  This format is felt to be most appropriate for this patient at this time.  All issues noted in this document were discussed and addressed.  A limited physical exam was performed with this format.  Please refer to the patient's chart for his consent to telehealth for [facility].   Date:  08/21/2018   ID:  Travis May, DOB June 26, 1932, MRN [id number]  Patient Location: Home Provider Location: Home  PCP:  Wenda Low, MD  Cardiologist:  Larae Grooms, MD   Electrophysiologist:  None   Evaluation Performed:  Follow-Up Visit  Chief Complaint:  f/u  History of Present Illness:    Travis May is a 85 y.o. male with history of Afib on Coumadin and diltiazem, mild AI  With normal LVEF 60-65% mils asymmetric septal hypertrophy, dilated LA and RA, mild MR on echo 2014, HLD. Last saw Dr. Irish Lack 09/04/17 and doing well.  Patient was exercising at [facility] in the [facility] program but now doing Zoom exercises 30 min 3 times a week. Limited by arthritis. Has lost 25 lbs in the past year. Started on metformin by PCP in Feb. Occasional leg swelling at the end of the day that is gone when he wakes up.   The patient does not have symptoms concerning for COVID-19 infection (fever, chills, cough, or new shortness of breath).    Past Medical History:  Diagnosis Date  . Arthritis   . Coronary artery disease   . Dysrhythmia    Atrial Fibrillation  . Sleep apnea    CPAP   Past Surgical History:  Procedure Laterality Date  . BACK SURGERY  1971   herniated disc  . COLONOSCOPY WITH PROPOFOL N/A 09/03/2012   Procedure: COLONOSCOPY WITH PROPOFOL;  Surgeon: Garlan Fair, MD;  Location: [facility];  Service: Endoscopy;  Laterality: N/A;  . EYE SURGERY  1980's   bilateral cataract with lens implants  . HERNIA REPAIR  1950,1980   right inguinal x3     Current Meds  Medication Sig  . b complex vitamins tablet Take 1 tablet by mouth daily.  . cholecalciferol (VITAMIN D) 1000 UNITS tablet Take 1,000 Units by mouth daily before breakfast.  . diltiazem (DILT-XR) 240 MG 24 hr capsule TAKE 1 CAPSULE BY MOUTH EVERY MORNING ON AN EMPTY STOMACH  . glucosamine-chondroitin 500-400 MG tablet Take 1 tablet by mouth 2 (two) times a day.   . latanoprost (XALATAN) 0.005 % ophthalmic solution Place 1 drop into both eyes at bedtime.  . Magnesium 250 MG TABS Take 1 tablet by mouth daily.  . metFORMIN (GLUCOPHAGE) 500 MG tablet Take 1 tablet by mouth daily.  . simvastatin (ZOCOR) 10 MG tablet Take 1 tablet (10 mg total) by mouth daily before breakfast.  . sodium chloride (OCEAN) 0.65 % nasal spray Place 1 spray into the nose daily as needed for congestion.  . Turmeric 450 MG CAPS Take by mouth daily.  Marland Kitchen warfarin (COUMADIN) 5 MG tablet TAKE 1 TABLET BY MOUTH ON MONDAYS AND FRIDAYS, AND ONE-HALF TABLET ALL OTHER DAYS OR AS DIRECTED BY COUMADIN CLINIC  . [DISCONTINUED] DILT-XR 240 MG 24 hr capsule TAKE 1  CAPSULE BY MOUTH EVERY MORNING ON AN EMPTY STOMACH  . [DISCONTINUED] simvastatin (ZOCOR) 10 MG tablet Take 10 mg by mouth daily before breakfast.     Allergies:   Sulfur and Sulfa antibiotics   Social History   Tobacco Use  . Smoking status: Former Smoker    Last attempt to quit: 08/13/1963    Years since quitting: 55.0  . Smokeless tobacco: Never Used  Substance Use Topics  . Alcohol use: Yes    Alcohol/week: 1.0 - 2.0 standard drinks    Types: 1 - 2 Cans of beer per week    Comment: daily-1-2 beers  . Drug use: No     Family Hx: The patient's family history includes Lung cancer in his father. There  is no history of Heart attack, Stroke, or Hypertension.  ROS:   Please see the history of present illness.      All other systems reviewed and are negative.   Prior CV studies:   The following studies were reviewed today:   normal LVEF 60-65% mils asymmetric septal hypertrophy, dilated LA and RA, mild MR on echo 2014   Labs/Other Tests and Data Reviewed:    EKG:  An ECG dated 09/05/18 was personally reviewed today and demonstrated:  Afib with IRBBB  Recent Labs: No results found for requested labs within last 8760 hours.   Recent Lipid Panel No results found for: CHOL, TRIG, HDL, CHOLHDL, LDLCALC, LDLDIRECT  Wt Readings from Last 3 Encounters:  08/21/18 212 lb (96.2 kg)  09/04/17 226 lb (102.5 kg)  08/24/16 238 lb 6.4 oz (108.1 kg)     Objective:    Vital Signs:  BP 124/63   Pulse 73   Ht 5\' 11"  (1.803 m)   Wt 212 lb (96.2 kg)   BMI 29.57 kg/m    VITAL SIGNS:  reviewed GEN:  no acute distress RESPIRATORY:  normal respiratory effort, symmetric expansion CARDIOVASCULAR:  no peripheral edema  ASSESSMENT & PLAN:    1. Persistent Afib on coumadin and diltiazem-no bleeding problems on coumadin.  2. HLD-PCP checked labs in Feb 2020 3. Mild AI and MR on echo 2014-no shortness of breath   COVID-19 Education: The signs and symptoms of COVID-19 were discussed with the patient and how to seek care for testing (follow up with PCP or arrange E-visit).   The importance of social distancing was discussed today.  Time:   Today, I have spent 17 minutes with the patient with telehealth technology discussing the above problems.     Medication Adjustments/Labs and Tests Ordered: Current medicines are reviewed at length with the patient today.  Concerns regarding medicines are outlined above.   Tests Ordered: No orders of the defined types were placed in this encounter.   Medication Changes: Meds ordered this encounter  Medications  . diltiazem (DILT-XR) 240 MG 24 hr  capsule    Sig: TAKE 1 CAPSULE BY MOUTH EVERY MORNING ON AN EMPTY STOMACH    Dispense:  90 capsule    Refill:  3  . simvastatin (ZOCOR) 10 MG tablet    Sig: Take 1 tablet (10 mg total) by mouth daily before breakfast.    Dispense:  90 tablet    Refill:  3    Disposition:  Follow up in 1 year(s) Dr. Irish Lack  Signed, Ermalinda Barrios, PA-C  08/21/2018 10:11 AM    [place]

## 2018-08-29 DIAGNOSIS — G4733 Obstructive sleep apnea (adult) (pediatric): Secondary | ICD-10-CM | POA: Diagnosis not present

## 2018-09-03 ENCOUNTER — Telehealth: Payer: Self-pay

## 2018-09-03 NOTE — Telephone Encounter (Signed)

## 2018-09-06 ENCOUNTER — Ambulatory Visit: Payer: Medicare Other | Admitting: Interventional Cardiology

## 2018-09-09 ENCOUNTER — Other Ambulatory Visit: Payer: Self-pay

## 2018-09-09 ENCOUNTER — Ambulatory Visit: Payer: Medicare Other | Admitting: *Deleted

## 2018-09-09 DIAGNOSIS — Z5181 Encounter for therapeutic drug level monitoring: Secondary | ICD-10-CM | POA: Diagnosis not present

## 2018-09-09 DIAGNOSIS — I4891 Unspecified atrial fibrillation: Secondary | ICD-10-CM

## 2018-09-09 LAB — POCT INR: INR: 2.3 (ref 2.0–3.0)

## 2018-09-09 NOTE — Patient Instructions (Signed)
Description   Continue same dosage 1/2 tablet on all days except 1 tablet on Mondays and Fridays. Recheck INR in 8 weeks.  Call if you have any concerns or placed on any new medications [phone].

## 2018-10-02 DIAGNOSIS — G4733 Obstructive sleep apnea (adult) (pediatric): Secondary | ICD-10-CM | POA: Diagnosis not present

## 2018-10-09 DIAGNOSIS — Z961 Presence of intraocular lens: Secondary | ICD-10-CM | POA: Diagnosis not present

## 2018-10-09 DIAGNOSIS — H43811 Vitreous degeneration, right eye: Secondary | ICD-10-CM | POA: Diagnosis not present

## 2018-10-09 DIAGNOSIS — T8579XS Infection and inflammatory reaction due to other internal prosthetic devices, implants and grafts, sequela: Secondary | ICD-10-CM | POA: Diagnosis not present

## 2018-10-09 DIAGNOSIS — H4042X1 Glaucoma secondary to eye inflammation, left eye, mild stage: Secondary | ICD-10-CM | POA: Diagnosis not present

## 2018-10-31 ENCOUNTER — Other Ambulatory Visit: Payer: Self-pay | Admitting: *Deleted

## 2018-10-31 MED ORDER — WARFARIN SODIUM 5 MG PO TABS: ORAL_TABLET | ORAL | 3 refills | Status: DC

## 2018-11-01 ENCOUNTER — Telehealth: Payer: Self-pay | Admitting: *Deleted

## 2018-11-01 NOTE — Telephone Encounter (Signed)

## 2018-11-04 ENCOUNTER — Other Ambulatory Visit: Payer: Self-pay

## 2018-11-04 ENCOUNTER — Ambulatory Visit (INDEPENDENT_AMBULATORY_CARE_PROVIDER_SITE_OTHER): Payer: Medicare Other | Admitting: *Deleted

## 2018-11-04 DIAGNOSIS — Z5181 Encounter for therapeutic drug level monitoring: Secondary | ICD-10-CM

## 2018-11-04 DIAGNOSIS — I4891 Unspecified atrial fibrillation: Secondary | ICD-10-CM | POA: Diagnosis not present

## 2018-11-04 LAB — POCT INR: INR: 2.1 (ref 2.0–3.0)

## 2018-11-04 NOTE — Patient Instructions (Signed)
Description   Continue same dosage 1/2 tablet on all days except 1 tablet on Mondays and Fridays. Recheck INR in 8 weeks.  Call if you have any concerns or placed on any new medications [phone].

## 2018-11-19 DIAGNOSIS — R7303 Prediabetes: Secondary | ICD-10-CM | POA: Diagnosis not present

## 2018-11-19 DIAGNOSIS — I482 Chronic atrial fibrillation, unspecified: Secondary | ICD-10-CM | POA: Diagnosis not present

## 2018-11-19 DIAGNOSIS — Z0000 Encounter for general adult medical examination without abnormal findings: Secondary | ICD-10-CM | POA: Diagnosis not present

## 2018-11-19 DIAGNOSIS — Z1389 Encounter for screening for other disorder: Secondary | ICD-10-CM | POA: Diagnosis not present

## 2018-12-02 DIAGNOSIS — G4733 Obstructive sleep apnea (adult) (pediatric): Secondary | ICD-10-CM | POA: Diagnosis not present

## 2018-12-05 DIAGNOSIS — R7303 Prediabetes: Secondary | ICD-10-CM | POA: Diagnosis not present

## 2018-12-05 DIAGNOSIS — E782 Mixed hyperlipidemia: Secondary | ICD-10-CM | POA: Diagnosis not present

## 2018-12-05 DIAGNOSIS — I482 Chronic atrial fibrillation, unspecified: Secondary | ICD-10-CM | POA: Diagnosis not present

## 2018-12-05 DIAGNOSIS — Z125 Encounter for screening for malignant neoplasm of prostate: Secondary | ICD-10-CM | POA: Diagnosis not present

## 2018-12-11 DIAGNOSIS — Z23 Encounter for immunization: Secondary | ICD-10-CM | POA: Diagnosis not present

## 2018-12-12 DIAGNOSIS — Z961 Presence of intraocular lens: Secondary | ICD-10-CM | POA: Diagnosis not present

## 2018-12-12 DIAGNOSIS — H40013 Open angle with borderline findings, low risk, bilateral: Secondary | ICD-10-CM | POA: Diagnosis not present

## 2018-12-12 DIAGNOSIS — H5213 Myopia, bilateral: Secondary | ICD-10-CM | POA: Diagnosis not present

## 2018-12-12 DIAGNOSIS — H40053 Ocular hypertension, bilateral: Secondary | ICD-10-CM | POA: Diagnosis not present

## 2018-12-30 ENCOUNTER — Other Ambulatory Visit: Payer: Self-pay

## 2018-12-30 ENCOUNTER — Ambulatory Visit: Payer: Medicare Other | Admitting: *Deleted

## 2018-12-30 DIAGNOSIS — Z7901 Long term (current) use of anticoagulants: Secondary | ICD-10-CM | POA: Diagnosis not present

## 2018-12-30 DIAGNOSIS — I359 Nonrheumatic aortic valve disorder, unspecified: Secondary | ICD-10-CM

## 2018-12-30 DIAGNOSIS — I4891 Unspecified atrial fibrillation: Secondary | ICD-10-CM | POA: Diagnosis not present

## 2018-12-30 DIAGNOSIS — Z5181 Encounter for therapeutic drug level monitoring: Secondary | ICD-10-CM | POA: Diagnosis not present

## 2018-12-30 LAB — POCT INR: INR: 2.4 (ref 2.0–3.0)

## 2018-12-30 NOTE — Patient Instructions (Signed)
Description   Continue same dosage 1/2 tablet on all days except 1 tablet on Mondays and Fridays. Recheck INR in 8 weeks.  Call if you have any concerns or placed on any new medications [phone].     

## 2019-01-14 DIAGNOSIS — D225 Melanocytic nevi of trunk: Secondary | ICD-10-CM | POA: Diagnosis not present

## 2019-01-14 DIAGNOSIS — L821 Other seborrheic keratosis: Secondary | ICD-10-CM | POA: Diagnosis not present

## 2019-01-14 DIAGNOSIS — L720 Epidermal cyst: Secondary | ICD-10-CM | POA: Diagnosis not present

## 2019-01-14 DIAGNOSIS — D1801 Hemangioma of skin and subcutaneous tissue: Secondary | ICD-10-CM | POA: Diagnosis not present

## 2019-02-05 ENCOUNTER — Other Ambulatory Visit: Payer: Self-pay | Admitting: *Deleted

## 2019-02-05 MED ORDER — WARFARIN SODIUM 5 MG PO TABS: ORAL_TABLET | ORAL | 2 refills | Status: DC

## 2019-02-24 ENCOUNTER — Other Ambulatory Visit: Payer: Self-pay

## 2019-02-24 ENCOUNTER — Ambulatory Visit: Payer: Medicare Other | Admitting: *Deleted

## 2019-02-24 DIAGNOSIS — Z5181 Encounter for therapeutic drug level monitoring: Secondary | ICD-10-CM

## 2019-02-24 LAB — POCT INR: INR: 2.3 (ref 2.0–3.0)

## 2019-02-24 NOTE — Patient Instructions (Signed)
Description   Continue same dosage 1/2 tablet on all days except 1 tablet on Mondays and Fridays. Recheck INR in 8 weeks.  Call if you have any concerns or placed on any new medications [phone].

## 2019-03-05 DIAGNOSIS — Z961 Presence of intraocular lens: Secondary | ICD-10-CM | POA: Diagnosis not present

## 2019-03-05 DIAGNOSIS — T8579XS Infection and inflammatory reaction due to other internal prosthetic devices, implants and grafts, sequela: Secondary | ICD-10-CM | POA: Diagnosis not present

## 2019-03-05 DIAGNOSIS — H4042X1 Glaucoma secondary to eye inflammation, left eye, mild stage: Secondary | ICD-10-CM | POA: Diagnosis not present

## 2019-03-05 DIAGNOSIS — H43811 Vitreous degeneration, right eye: Secondary | ICD-10-CM | POA: Diagnosis not present

## 2019-03-21 DIAGNOSIS — G4733 Obstructive sleep apnea (adult) (pediatric): Secondary | ICD-10-CM | POA: Diagnosis not present

## 2019-04-21 ENCOUNTER — Ambulatory Visit (INDEPENDENT_AMBULATORY_CARE_PROVIDER_SITE_OTHER): Payer: Medicare Other | Admitting: Pharmacist

## 2019-04-21 ENCOUNTER — Other Ambulatory Visit: Payer: Self-pay

## 2019-04-21 DIAGNOSIS — Z5181 Encounter for therapeutic drug level monitoring: Secondary | ICD-10-CM | POA: Diagnosis not present

## 2019-04-21 DIAGNOSIS — Z7901 Long term (current) use of anticoagulants: Secondary | ICD-10-CM

## 2019-04-21 LAB — POCT INR: INR: 2.6 (ref 2.0–3.0)

## 2019-04-21 NOTE — Patient Instructions (Signed)
Description   Continue same dosage 1/2 tablet on all days except 1 tablet on Mondays and Fridays. Recheck INR in 8 weeks.  Call if you have any concerns or placed on any new medications [phone].

## 2019-06-20 ENCOUNTER — Encounter (INDEPENDENT_AMBULATORY_CARE_PROVIDER_SITE_OTHER): Payer: Self-pay

## 2019-06-20 ENCOUNTER — Ambulatory Visit: Payer: Medicare Other

## 2019-06-20 ENCOUNTER — Other Ambulatory Visit: Payer: Self-pay

## 2019-06-20 DIAGNOSIS — I4891 Unspecified atrial fibrillation: Secondary | ICD-10-CM | POA: Diagnosis not present

## 2019-06-20 DIAGNOSIS — Z5181 Encounter for therapeutic drug level monitoring: Secondary | ICD-10-CM

## 2019-06-20 LAB — POCT INR: INR: 2.2 (ref 2.0–3.0)

## 2019-06-20 NOTE — Patient Instructions (Signed)
Description   Continue same dosage 1/2 tablet on all days except 1 tablet on Mondays and Fridays. Recheck INR in 8 weeks.  Call if you have any concerns or placed on any new medications [phone].

## 2019-07-07 DIAGNOSIS — H43811 Vitreous degeneration, right eye: Secondary | ICD-10-CM | POA: Diagnosis not present

## 2019-07-07 DIAGNOSIS — Z961 Presence of intraocular lens: Secondary | ICD-10-CM | POA: Diagnosis not present

## 2019-07-07 DIAGNOSIS — H4042X1 Glaucoma secondary to eye inflammation, left eye, mild stage: Secondary | ICD-10-CM | POA: Diagnosis not present

## 2019-07-07 DIAGNOSIS — T8579XS Infection and inflammatory reaction due to other internal prosthetic devices, implants and grafts, sequela: Secondary | ICD-10-CM | POA: Diagnosis not present

## 2019-08-01 DIAGNOSIS — E782 Mixed hyperlipidemia: Secondary | ICD-10-CM | POA: Diagnosis not present

## 2019-08-01 DIAGNOSIS — M179 Osteoarthritis of knee, unspecified: Secondary | ICD-10-CM | POA: Diagnosis not present

## 2019-08-01 DIAGNOSIS — E7800 Pure hypercholesterolemia, unspecified: Secondary | ICD-10-CM | POA: Diagnosis not present

## 2019-08-01 DIAGNOSIS — M1990 Unspecified osteoarthritis, unspecified site: Secondary | ICD-10-CM | POA: Diagnosis not present

## 2019-08-15 ENCOUNTER — Ambulatory Visit: Payer: Medicare Other | Admitting: *Deleted

## 2019-08-15 ENCOUNTER — Other Ambulatory Visit: Payer: Self-pay

## 2019-08-15 DIAGNOSIS — I4891 Unspecified atrial fibrillation: Secondary | ICD-10-CM

## 2019-08-15 DIAGNOSIS — Z5181 Encounter for therapeutic drug level monitoring: Secondary | ICD-10-CM

## 2019-08-15 LAB — POCT INR: INR: 2.2 (ref 2.0–3.0)

## 2019-08-15 NOTE — Patient Instructions (Signed)
Description   Continue same dosage 1/2 tablet on all days except 1 tablet on Mondays and Fridays. Recheck INR in 8 weeks.  Call if you have any concerns or placed on any new medications [phone].

## 2019-09-02 ENCOUNTER — Other Ambulatory Visit: Payer: Self-pay | Admitting: Pharmacist

## 2019-09-02 MED ORDER — WARFARIN SODIUM 5 MG PO TABS: ORAL_TABLET | ORAL | 2 refills | Status: DC

## 2019-09-07 ENCOUNTER — Other Ambulatory Visit: Payer: Self-pay | Admitting: Physician Assistant

## 2019-09-17 DIAGNOSIS — G4733 Obstructive sleep apnea (adult) (pediatric): Secondary | ICD-10-CM | POA: Diagnosis not present

## 2019-09-28 DIAGNOSIS — I482 Chronic atrial fibrillation, unspecified: Secondary | ICD-10-CM | POA: Diagnosis not present

## 2019-09-28 DIAGNOSIS — E7800 Pure hypercholesterolemia, unspecified: Secondary | ICD-10-CM | POA: Diagnosis not present

## 2019-09-28 DIAGNOSIS — M1990 Unspecified osteoarthritis, unspecified site: Secondary | ICD-10-CM | POA: Diagnosis not present

## 2019-09-28 DIAGNOSIS — E782 Mixed hyperlipidemia: Secondary | ICD-10-CM | POA: Diagnosis not present

## 2019-10-01 DIAGNOSIS — G4733 Obstructive sleep apnea (adult) (pediatric): Secondary | ICD-10-CM | POA: Diagnosis not present

## 2019-10-03 DIAGNOSIS — E7800 Pure hypercholesterolemia, unspecified: Secondary | ICD-10-CM | POA: Diagnosis not present

## 2019-10-03 DIAGNOSIS — R7309 Other abnormal glucose: Secondary | ICD-10-CM | POA: Diagnosis not present

## 2019-10-03 DIAGNOSIS — G4733 Obstructive sleep apnea (adult) (pediatric): Secondary | ICD-10-CM | POA: Diagnosis not present

## 2019-10-03 DIAGNOSIS — I482 Chronic atrial fibrillation, unspecified: Secondary | ICD-10-CM | POA: Diagnosis not present

## 2019-10-08 ENCOUNTER — Other Ambulatory Visit: Payer: Self-pay

## 2019-10-08 MED ORDER — DILTIAZEM HCL ER 240 MG PO CP24: ORAL_CAPSULE | ORAL | 1 refills | Status: DC

## 2019-10-09 DIAGNOSIS — E7800 Pure hypercholesterolemia, unspecified: Secondary | ICD-10-CM | POA: Diagnosis not present

## 2019-10-09 DIAGNOSIS — M1990 Unspecified osteoarthritis, unspecified site: Secondary | ICD-10-CM | POA: Diagnosis not present

## 2019-10-09 DIAGNOSIS — E782 Mixed hyperlipidemia: Secondary | ICD-10-CM | POA: Diagnosis not present

## 2019-10-09 DIAGNOSIS — J309 Allergic rhinitis, unspecified: Secondary | ICD-10-CM | POA: Diagnosis not present

## 2019-10-09 DIAGNOSIS — M179 Osteoarthritis of knee, unspecified: Secondary | ICD-10-CM | POA: Diagnosis not present

## 2019-10-10 ENCOUNTER — Ambulatory Visit (INDEPENDENT_AMBULATORY_CARE_PROVIDER_SITE_OTHER): Payer: Medicare Other | Admitting: *Deleted

## 2019-10-10 ENCOUNTER — Other Ambulatory Visit: Payer: Self-pay

## 2019-10-10 DIAGNOSIS — Z5181 Encounter for therapeutic drug level monitoring: Secondary | ICD-10-CM | POA: Diagnosis not present

## 2019-10-10 DIAGNOSIS — I4891 Unspecified atrial fibrillation: Secondary | ICD-10-CM

## 2019-10-10 LAB — POCT INR: INR: 2.1 (ref 2.0–3.0)

## 2019-10-10 NOTE — Patient Instructions (Addendum)
Description   Continue taking Warfarin 1/2 tablet daily except 1 tablet on Mondays and Fridays. Recheck INR in 8 weeks. Call if you have any concerns or placed on any new medications [phone].     

## 2019-10-12 ENCOUNTER — Other Ambulatory Visit: Payer: Self-pay | Admitting: Physician Assistant

## 2019-10-26 NOTE — Progress Notes (Signed)
Cardiology Office Note   Date:  10/28/2019   ID:  Travis May, DOB [birth year], MRN [id number]  PCP:  Wenda Low, MD    No chief complaint on file.  AFib  Wt Readings from Last 3 Encounters:  10/28/19 (!) 224 lb 3.2 oz (101.7 kg)  08/21/18 212 lb (96.2 kg)  09/04/17 226 lb (102.5 kg)       History of Present Illness: Travis May is a 86 y.o. male who has chronic atrial fibrillation.  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  This was before Covid.  He would get his blood pressure checked there.  In the past, hip pain and knee pain have limited his exercise along with foot pain.  He has had some visual issues in the past.  He felt like the elliptical bothered his hips and knees.  Since the last visit, exercise programs have become virtually.  He got his COVID shots.   Denies : Chest pain. Dizziness. Leg edema. Nitroglycerin use. Orthopnea. Palpitations. Paroxysmal nocturnal dyspnea. Shortness of breath. Syncope.     Past Medical History:  Diagnosis Date  . Arthritis   . Coronary artery disease   . Dysrhythmia    Atrial Fibrillation  . Sleep apnea    CPAP    Past Surgical History:  Procedure Laterality Date  . BACK SURGERY  1971   herniated disc  . COLONOSCOPY WITH PROPOFOL N/A 09/03/2012   Procedure: COLONOSCOPY WITH PROPOFOL;  Surgeon: Garlan Fair, MD;  Location: [facility];  Service: Endoscopy;  Laterality: N/A;  . EYE SURGERY  1980's   bilateral cataract with lens implants  . HERNIA REPAIR  1950,1980   right inguinal x3     Current Outpatient Medications  Medication Sig Dispense Refill  . b complex vitamins tablet Take 1 tablet by mouth daily.    . cholecalciferol (VITAMIN D) 1000 UNITS tablet Take 1,000 Units by mouth daily before breakfast.    . diltiazem (DILT-XR) 240 MG 24 hr capsule TAKE 1 CAPSULE BY MOUTH EVERY MORNING ON AN EMPTY STOMACH. Please keep upcoming appt in July  for future refills. Thank you 30 capsule 1  . GLUCOSAMINE-CHONDROITIN PO Take 1,200 mg by mouth 2 (two) times daily. 1200-1400mg  tablet    . latanoprost (XALATAN) 0.005 % ophthalmic solution Place 1 drop into both eyes at bedtime.  10  . Magnesium 250 MG TABS Take 1 tablet by mouth daily.    . metFORMIN (GLUCOPHAGE) 500 MG tablet Take 1 tablet by mouth daily.    . simvastatin (ZOCOR) 10 MG tablet TAKE 1 TABLET(10 MG) BY MOUTH DAILY BEFORE BREAKFAST 90 tablet 3  . sodium chloride (OCEAN) 0.65 % nasal spray Place 1 spray into the nose daily as needed for congestion.    . Turmeric 450 MG CAPS Take by mouth daily.    Marland Kitchen warfarin (COUMADIN) 5 MG tablet Take as directed by the Coumadin Clinic 23 tablet 2   No current facility-administered medications for this visit.    Allergies:   Sulfur and Sulfa antibiotics    Social History:  The patient  reports that he quit smoking about 56 years ago. He has never used smokeless tobacco. He reports current alcohol use of about 1.0 - 2.0 standard drink of alcohol per week. He reports that he does not use drugs.   Family History:  The patient's family history includes Lung cancer in his father.  ROS:  Please see the history of present illness.   Otherwise, review of systems are positive for joint pains.   All other systems are reviewed and negative.    PHYSICAL EXAM: VS:  BP (!) 110/62   Pulse 60   Ht 5\' 11"  (1.803 m)   Wt (!) 224 lb 3.2 oz (101.7 kg)   SpO2 95%   BMI 31.27 kg/m  , BMI Body mass index is 31.27 kg/m. GEN: Well nourished, well developed, in no acute distress  HEENT: normal  Neck: no JVD, carotid bruits, or masses Cardiac: irregularly iregular; no murmurs, rubs, or gallops,no edema  Respiratory:  clear to auscultation bilaterally, normal work of breathing GI: soft, nontender, nondistended, + BS MS: no deformity or atrophy  Skin: warm and dry, no rash Neuro:  Strength and sensation are intact Psych: euthymic mood, full  affect   EKG:   The ekg ordered today demonstrates AFib, rate controlled   Recent Labs: No results found for requested labs within last 8760 hours.   Lipid Panel No results found for: CHOL, TRIG, HDL, CHOLHDL, VLDL, LDLCALC, LDLDIRECT   Other studies Reviewed: Additional studies/ records that were reviewed today with results demonstrating: labs reviewed   ASSESSMENT AND PLAN:  1. A. Fib: Diltiazem for rate control.  No palpitations.  ECG shows controlled rate.  2. Mild aortic insufficiency: Noted in 2014. 3. Hyperlipidemia: Continue lipid-lowering therapy.  He has been on low-dose simvastatin.  Dose has been limited by the fact that he is on diltiazem. 4. Anticoagulated: Coumadin for stroke prevention.  No bleeding issues. 5. DM: A1C 6.4 in 7/21.  No nonhealing sores.  No claudication.   Current medicines are reviewed at length with the patient today.  The patient concerns regarding his medicines were addressed.  The following changes have been made:  No change  Labs/ tests ordered today include:  No orders of the defined types were placed in this encounter.   Recommend 150 minutes/week of aerobic exercise Low fat, low carb, high fiber diet recommended  Disposition:   FU in 1 year   Signed, Larae Grooms, MD  10/28/2019 9:45 AM    [facility] [address], [place], [place]  [address] Phone: [phone]; Fax: [phone]

## 2019-10-28 ENCOUNTER — Other Ambulatory Visit: Payer: Self-pay

## 2019-10-28 ENCOUNTER — Ambulatory Visit: Payer: Medicare Other | Admitting: Interventional Cardiology

## 2019-10-28 ENCOUNTER — Encounter: Payer: Self-pay | Admitting: Interventional Cardiology

## 2019-10-28 VITALS — BP 110/62 | HR 60 | Ht 71.0 in | Wt 224.2 lb

## 2019-10-28 DIAGNOSIS — Z7901 Long term (current) use of anticoagulants: Secondary | ICD-10-CM | POA: Diagnosis not present

## 2019-10-28 DIAGNOSIS — I359 Nonrheumatic aortic valve disorder, unspecified: Secondary | ICD-10-CM | POA: Diagnosis not present

## 2019-10-28 DIAGNOSIS — E782 Mixed hyperlipidemia: Secondary | ICD-10-CM

## 2019-10-28 DIAGNOSIS — I4821 Permanent atrial fibrillation: Secondary | ICD-10-CM | POA: Diagnosis not present

## 2019-10-28 NOTE — Patient Instructions (Signed)
Medication Instructions:  Your physician recommends that you continue on your current medications as directed. Please refer to the Current Medication list given to you today.  *If you need a refill on your cardiac medications before your next appointment, please call your pharmacy*   Lab Work: None If you have labs (blood work) drawn today and your tests are completely normal, you will receive your results only by: Marland Kitchen MyChart Message (if you have MyChart) OR . A paper copy in the mail If you have any lab test that is abnormal or we need to change your treatment, we will call you to review the results.   Testing/Procedures: None   Follow-Up: At [facility], you and your health needs are our priority.  As part of our continuing mission to provide you with exceptional heart care, we have created designated Provider Care Teams.  These Care Teams include your primary Cardiologist (physician) and Advanced Practice Providers (APPs -  Physician Assistants and Nurse Practitioners) who all work together to provide you with the care you need, when you need it.  We recommend signing up for the patient portal called "MyChart".  Sign up information is provided on this After Visit Summary.  MyChart is used to connect with patients for Virtual Visits (Telemedicine).  Patients are able to view lab/test results, encounter notes, upcoming appointments, etc.  Non-urgent messages can be sent to your provider as well.   To learn more about what you can do with MyChart, go to [email or web address].    Your next appointment:   12 month(s)  The format for your next appointment:   In Person  Provider:   You may see Larae Grooms, MD or one of the following Advanced Practice Providers on your designated Care Team:    Melina Copa, PA-C  Ermalinda Barrios, PA-C

## 2019-11-04 DIAGNOSIS — T8579XS Infection and inflammatory reaction due to other internal prosthetic devices, implants and grafts, sequela: Secondary | ICD-10-CM | POA: Diagnosis not present

## 2019-11-04 DIAGNOSIS — H43811 Vitreous degeneration, right eye: Secondary | ICD-10-CM | POA: Diagnosis not present

## 2019-11-04 DIAGNOSIS — H4042X1 Glaucoma secondary to eye inflammation, left eye, mild stage: Secondary | ICD-10-CM | POA: Diagnosis not present

## 2019-11-04 DIAGNOSIS — Z961 Presence of intraocular lens: Secondary | ICD-10-CM | POA: Diagnosis not present

## 2019-11-14 ENCOUNTER — Other Ambulatory Visit: Payer: Self-pay | Admitting: Interventional Cardiology

## 2019-11-26 DIAGNOSIS — I482 Chronic atrial fibrillation, unspecified: Secondary | ICD-10-CM | POA: Diagnosis not present

## 2019-11-26 DIAGNOSIS — M1990 Unspecified osteoarthritis, unspecified site: Secondary | ICD-10-CM | POA: Diagnosis not present

## 2019-11-26 DIAGNOSIS — E7800 Pure hypercholesterolemia, unspecified: Secondary | ICD-10-CM | POA: Diagnosis not present

## 2019-11-26 DIAGNOSIS — E782 Mixed hyperlipidemia: Secondary | ICD-10-CM | POA: Diagnosis not present

## 2019-12-01 ENCOUNTER — Other Ambulatory Visit: Payer: Self-pay | Admitting: *Deleted

## 2019-12-01 MED ORDER — WARFARIN SODIUM 5 MG PO TABS: ORAL_TABLET | ORAL | 2 refills | Status: DC

## 2019-12-02 MED ORDER — WARFARIN SODIUM 5 MG PO TABS: ORAL_TABLET | ORAL | 1 refills | Status: DC

## 2019-12-02 NOTE — Addendum Note (Signed)
Addended by: Brynda Peon on: 12/02/2019 08:17 AM   Modules accepted: Orders

## 2019-12-02 NOTE — Telephone Encounter (Signed)
Received fax from [facility] stating pt is requesting a 90 day refill, rx refill sent to pharmacy.

## 2019-12-05 ENCOUNTER — Other Ambulatory Visit: Payer: Self-pay

## 2019-12-05 ENCOUNTER — Ambulatory Visit: Payer: Medicare Other | Admitting: *Deleted

## 2019-12-05 DIAGNOSIS — Z5181 Encounter for therapeutic drug level monitoring: Secondary | ICD-10-CM | POA: Diagnosis not present

## 2019-12-05 DIAGNOSIS — I4891 Unspecified atrial fibrillation: Secondary | ICD-10-CM | POA: Diagnosis not present

## 2019-12-05 LAB — POCT INR: INR: 2.3 (ref 2.0–3.0)

## 2019-12-05 NOTE — Patient Instructions (Signed)
Description   Continue taking Warfarin 1/2 tablet daily except 1 tablet on Mondays and Fridays. Recheck INR in 8 weeks. Call if you have any concerns or placed on any new medications [phone].     

## 2019-12-16 DIAGNOSIS — H5213 Myopia, bilateral: Secondary | ICD-10-CM | POA: Diagnosis not present

## 2019-12-22 DIAGNOSIS — I482 Chronic atrial fibrillation, unspecified: Secondary | ICD-10-CM | POA: Diagnosis not present

## 2019-12-22 DIAGNOSIS — E782 Mixed hyperlipidemia: Secondary | ICD-10-CM | POA: Diagnosis not present

## 2019-12-22 DIAGNOSIS — E7800 Pure hypercholesterolemia, unspecified: Secondary | ICD-10-CM | POA: Diagnosis not present

## 2019-12-22 DIAGNOSIS — M1990 Unspecified osteoarthritis, unspecified site: Secondary | ICD-10-CM | POA: Diagnosis not present

## 2019-12-23 DIAGNOSIS — G4733 Obstructive sleep apnea (adult) (pediatric): Secondary | ICD-10-CM | POA: Diagnosis not present

## 2020-01-13 DIAGNOSIS — Z1389 Encounter for screening for other disorder: Secondary | ICD-10-CM | POA: Diagnosis not present

## 2020-01-13 DIAGNOSIS — J309 Allergic rhinitis, unspecified: Secondary | ICD-10-CM | POA: Diagnosis not present

## 2020-01-13 DIAGNOSIS — Z23 Encounter for immunization: Secondary | ICD-10-CM | POA: Diagnosis not present

## 2020-01-13 DIAGNOSIS — Z0000 Encounter for general adult medical examination without abnormal findings: Secondary | ICD-10-CM | POA: Diagnosis not present

## 2020-01-13 DIAGNOSIS — I482 Chronic atrial fibrillation, unspecified: Secondary | ICD-10-CM | POA: Diagnosis not present

## 2020-01-13 DIAGNOSIS — G4733 Obstructive sleep apnea (adult) (pediatric): Secondary | ICD-10-CM | POA: Diagnosis not present

## 2020-01-13 DIAGNOSIS — R7309 Other abnormal glucose: Secondary | ICD-10-CM | POA: Diagnosis not present

## 2020-01-15 DIAGNOSIS — L814 Other melanin hyperpigmentation: Secondary | ICD-10-CM | POA: Diagnosis not present

## 2020-01-15 DIAGNOSIS — D1801 Hemangioma of skin and subcutaneous tissue: Secondary | ICD-10-CM | POA: Diagnosis not present

## 2020-01-15 DIAGNOSIS — D225 Melanocytic nevi of trunk: Secondary | ICD-10-CM | POA: Diagnosis not present

## 2020-01-15 DIAGNOSIS — L821 Other seborrheic keratosis: Secondary | ICD-10-CM | POA: Diagnosis not present

## 2020-01-30 ENCOUNTER — Other Ambulatory Visit: Payer: Self-pay

## 2020-01-30 ENCOUNTER — Ambulatory Visit: Payer: Medicare Other | Admitting: *Deleted

## 2020-01-30 DIAGNOSIS — Z5181 Encounter for therapeutic drug level monitoring: Secondary | ICD-10-CM | POA: Diagnosis not present

## 2020-01-30 DIAGNOSIS — I4891 Unspecified atrial fibrillation: Secondary | ICD-10-CM

## 2020-01-30 LAB — POCT INR: INR: 2.6 (ref 2.0–3.0)

## 2020-01-30 NOTE — Patient Instructions (Signed)
Description   Continue taking Warfarin 1/2 tablet daily except 1 tablet on Mondays and Fridays. Recheck INR in 8 weeks. Call if you have any concerns or placed on any new medications [phone].

## 2020-02-03 DIAGNOSIS — H524 Presbyopia: Secondary | ICD-10-CM | POA: Diagnosis not present

## 2020-02-13 DIAGNOSIS — E782 Mixed hyperlipidemia: Secondary | ICD-10-CM | POA: Diagnosis not present

## 2020-02-13 DIAGNOSIS — E7800 Pure hypercholesterolemia, unspecified: Secondary | ICD-10-CM | POA: Diagnosis not present

## 2020-02-13 DIAGNOSIS — I482 Chronic atrial fibrillation, unspecified: Secondary | ICD-10-CM | POA: Diagnosis not present

## 2020-02-13 DIAGNOSIS — M179 Osteoarthritis of knee, unspecified: Secondary | ICD-10-CM | POA: Diagnosis not present

## 2020-03-03 DIAGNOSIS — H43811 Vitreous degeneration, right eye: Secondary | ICD-10-CM | POA: Diagnosis not present

## 2020-03-03 DIAGNOSIS — Z961 Presence of intraocular lens: Secondary | ICD-10-CM | POA: Diagnosis not present

## 2020-03-03 DIAGNOSIS — H4042X1 Glaucoma secondary to eye inflammation, left eye, mild stage: Secondary | ICD-10-CM | POA: Diagnosis not present

## 2020-03-03 DIAGNOSIS — T8579XS Infection and inflammatory reaction due to other internal prosthetic devices, implants and grafts, sequela: Secondary | ICD-10-CM | POA: Diagnosis not present

## 2020-03-19 ENCOUNTER — Ambulatory Visit: Payer: Medicare Other

## 2020-03-19 ENCOUNTER — Other Ambulatory Visit: Payer: Self-pay

## 2020-03-19 DIAGNOSIS — I4891 Unspecified atrial fibrillation: Secondary | ICD-10-CM

## 2020-03-19 DIAGNOSIS — Z5181 Encounter for therapeutic drug level monitoring: Secondary | ICD-10-CM | POA: Diagnosis not present

## 2020-03-19 LAB — POCT INR: INR: 2.2 (ref 2.0–3.0)

## 2020-03-19 NOTE — Patient Instructions (Signed)
Description   Continue taking Warfarin 1/2 tablet daily except 1 tablet on Mondays and Fridays. Recheck INR in 8 weeks. Call if you have any concerns or placed on any new medications [phone].

## 2020-05-03 DIAGNOSIS — I482 Chronic atrial fibrillation, unspecified: Secondary | ICD-10-CM | POA: Diagnosis not present

## 2020-05-03 DIAGNOSIS — E782 Mixed hyperlipidemia: Secondary | ICD-10-CM | POA: Diagnosis not present

## 2020-05-03 DIAGNOSIS — E7800 Pure hypercholesterolemia, unspecified: Secondary | ICD-10-CM | POA: Diagnosis not present

## 2020-05-03 DIAGNOSIS — G4733 Obstructive sleep apnea (adult) (pediatric): Secondary | ICD-10-CM | POA: Diagnosis not present

## 2020-05-03 DIAGNOSIS — M1990 Unspecified osteoarthritis, unspecified site: Secondary | ICD-10-CM | POA: Diagnosis not present

## 2020-05-14 ENCOUNTER — Ambulatory Visit (INDEPENDENT_AMBULATORY_CARE_PROVIDER_SITE_OTHER): Payer: Medicare Other

## 2020-05-14 ENCOUNTER — Other Ambulatory Visit: Payer: Self-pay

## 2020-05-14 DIAGNOSIS — Z5181 Encounter for therapeutic drug level monitoring: Secondary | ICD-10-CM

## 2020-05-14 DIAGNOSIS — I4891 Unspecified atrial fibrillation: Secondary | ICD-10-CM | POA: Diagnosis not present

## 2020-05-14 LAB — POCT INR: INR: 2.9 (ref 2.0–3.0)

## 2020-05-14 NOTE — Patient Instructions (Signed)
Description   Continue on same dosage of Warfarin 1/2 tablet daily except 1 tablet on Mondays and Fridays. Recheck INR in 8 weeks. Call if you have any concerns or placed on any new medications [phone].

## 2020-05-19 DIAGNOSIS — M1711 Unilateral primary osteoarthritis, right knee: Secondary | ICD-10-CM | POA: Diagnosis not present

## 2020-05-19 DIAGNOSIS — M1712 Unilateral primary osteoarthritis, left knee: Secondary | ICD-10-CM | POA: Diagnosis not present

## 2020-05-21 DIAGNOSIS — M179 Osteoarthritis of knee, unspecified: Secondary | ICD-10-CM | POA: Diagnosis not present

## 2020-05-21 DIAGNOSIS — E7800 Pure hypercholesterolemia, unspecified: Secondary | ICD-10-CM | POA: Diagnosis not present

## 2020-05-21 DIAGNOSIS — M1990 Unspecified osteoarthritis, unspecified site: Secondary | ICD-10-CM | POA: Diagnosis not present

## 2020-05-21 DIAGNOSIS — E782 Mixed hyperlipidemia: Secondary | ICD-10-CM | POA: Diagnosis not present

## 2020-06-20 ENCOUNTER — Other Ambulatory Visit: Payer: Self-pay | Admitting: Interventional Cardiology

## 2020-06-20 DIAGNOSIS — I4891 Unspecified atrial fibrillation: Secondary | ICD-10-CM

## 2020-06-22 DIAGNOSIS — M1712 Unilateral primary osteoarthritis, left knee: Secondary | ICD-10-CM | POA: Diagnosis not present

## 2020-06-22 DIAGNOSIS — M1711 Unilateral primary osteoarthritis, right knee: Secondary | ICD-10-CM | POA: Diagnosis not present

## 2020-07-09 ENCOUNTER — Encounter (INDEPENDENT_AMBULATORY_CARE_PROVIDER_SITE_OTHER): Payer: Self-pay

## 2020-07-09 ENCOUNTER — Other Ambulatory Visit: Payer: Self-pay

## 2020-07-09 ENCOUNTER — Ambulatory Visit (INDEPENDENT_AMBULATORY_CARE_PROVIDER_SITE_OTHER): Payer: Medicare Other | Admitting: Pharmacist

## 2020-07-09 DIAGNOSIS — I4811 Longstanding persistent atrial fibrillation: Secondary | ICD-10-CM

## 2020-07-09 DIAGNOSIS — Z5181 Encounter for therapeutic drug level monitoring: Secondary | ICD-10-CM

## 2020-07-09 LAB — POCT INR: INR: 1.4 — AB (ref 2.0–3.0)

## 2020-07-09 NOTE — Patient Instructions (Addendum)
Description   Take 2 tablets today and then continue on same dosage of Warfarin 1/2 tablet daily except 1 tablet on Mondays and Fridays. Recheck INR in 3 weeks. Call if you have any concerns or placed on any new medications [phone].

## 2020-07-16 DIAGNOSIS — E782 Mixed hyperlipidemia: Secondary | ICD-10-CM | POA: Diagnosis not present

## 2020-07-16 DIAGNOSIS — M1990 Unspecified osteoarthritis, unspecified site: Secondary | ICD-10-CM | POA: Diagnosis not present

## 2020-07-16 DIAGNOSIS — E7800 Pure hypercholesterolemia, unspecified: Secondary | ICD-10-CM | POA: Diagnosis not present

## 2020-07-16 DIAGNOSIS — M179 Osteoarthritis of knee, unspecified: Secondary | ICD-10-CM | POA: Diagnosis not present

## 2020-07-19 DIAGNOSIS — H4042X1 Glaucoma secondary to eye inflammation, left eye, mild stage: Secondary | ICD-10-CM | POA: Diagnosis not present

## 2020-07-19 DIAGNOSIS — H43811 Vitreous degeneration, right eye: Secondary | ICD-10-CM | POA: Diagnosis not present

## 2020-07-19 DIAGNOSIS — T8579XS Infection and inflammatory reaction due to other internal prosthetic devices, implants and grafts, sequela: Secondary | ICD-10-CM | POA: Diagnosis not present

## 2020-07-19 DIAGNOSIS — Z961 Presence of intraocular lens: Secondary | ICD-10-CM | POA: Diagnosis not present

## 2020-07-22 DIAGNOSIS — G4733 Obstructive sleep apnea (adult) (pediatric): Secondary | ICD-10-CM | POA: Diagnosis not present

## 2020-07-22 DIAGNOSIS — D6869 Other thrombophilia: Secondary | ICD-10-CM | POA: Diagnosis not present

## 2020-07-22 DIAGNOSIS — E782 Mixed hyperlipidemia: Secondary | ICD-10-CM | POA: Diagnosis not present

## 2020-07-22 DIAGNOSIS — R7303 Prediabetes: Secondary | ICD-10-CM | POA: Diagnosis not present

## 2020-07-28 ENCOUNTER — Other Ambulatory Visit: Payer: Self-pay

## 2020-07-28 ENCOUNTER — Ambulatory Visit (INDEPENDENT_AMBULATORY_CARE_PROVIDER_SITE_OTHER): Payer: Medicare Other | Admitting: *Deleted

## 2020-07-28 DIAGNOSIS — Z5181 Encounter for therapeutic drug level monitoring: Secondary | ICD-10-CM

## 2020-07-28 LAB — POCT INR: INR: 2.8 (ref 2.0–3.0)

## 2020-07-28 NOTE — Patient Instructions (Signed)
Description   Continue on same dosage of Warfarin 1/2 tablet daily except 1 tablet on Mondays and Fridays. Recheck INR in 6 weeks. Call if you have any concerns or placed on any new medications [phone].

## 2020-08-26 DIAGNOSIS — M25562 Pain in left knee: Secondary | ICD-10-CM | POA: Diagnosis not present

## 2020-08-26 DIAGNOSIS — M25561 Pain in right knee: Secondary | ICD-10-CM | POA: Diagnosis not present

## 2020-08-31 DIAGNOSIS — M79671 Pain in right foot: Secondary | ICD-10-CM | POA: Diagnosis not present

## 2020-08-31 DIAGNOSIS — M19071 Primary osteoarthritis, right ankle and foot: Secondary | ICD-10-CM | POA: Diagnosis not present

## 2020-08-31 DIAGNOSIS — M79672 Pain in left foot: Secondary | ICD-10-CM | POA: Diagnosis not present

## 2020-09-06 DIAGNOSIS — M25562 Pain in left knee: Secondary | ICD-10-CM | POA: Diagnosis not present

## 2020-09-08 ENCOUNTER — Other Ambulatory Visit: Payer: Self-pay

## 2020-09-08 ENCOUNTER — Ambulatory Visit (INDEPENDENT_AMBULATORY_CARE_PROVIDER_SITE_OTHER): Payer: Medicare Other | Admitting: *Deleted

## 2020-09-08 DIAGNOSIS — Z5181 Encounter for therapeutic drug level monitoring: Secondary | ICD-10-CM | POA: Diagnosis not present

## 2020-09-08 DIAGNOSIS — I4891 Unspecified atrial fibrillation: Secondary | ICD-10-CM

## 2020-09-08 LAB — POCT INR: INR: 2.5 (ref 2.0–3.0)

## 2020-09-08 NOTE — Patient Instructions (Signed)
Description   Continue taking Warfarin 1/2 tablet daily except 1 tablet on Mondays and Fridays. Recheck INR in 6 weeks. Call if you have any concerns or placed on any new medications [phone].

## 2020-09-15 DIAGNOSIS — M1990 Unspecified osteoarthritis, unspecified site: Secondary | ICD-10-CM | POA: Diagnosis not present

## 2020-09-15 DIAGNOSIS — M179 Osteoarthritis of knee, unspecified: Secondary | ICD-10-CM | POA: Diagnosis not present

## 2020-09-15 DIAGNOSIS — E7800 Pure hypercholesterolemia, unspecified: Secondary | ICD-10-CM | POA: Diagnosis not present

## 2020-09-15 DIAGNOSIS — E782 Mixed hyperlipidemia: Secondary | ICD-10-CM | POA: Diagnosis not present

## 2020-09-30 DIAGNOSIS — G4733 Obstructive sleep apnea (adult) (pediatric): Secondary | ICD-10-CM | POA: Diagnosis not present

## 2020-10-03 ENCOUNTER — Other Ambulatory Visit: Payer: Self-pay | Admitting: Physician Assistant

## 2020-10-08 ENCOUNTER — Other Ambulatory Visit: Payer: Self-pay | Admitting: *Deleted

## 2020-10-11 DIAGNOSIS — M179 Osteoarthritis of knee, unspecified: Secondary | ICD-10-CM | POA: Diagnosis not present

## 2020-10-11 DIAGNOSIS — M1990 Unspecified osteoarthritis, unspecified site: Secondary | ICD-10-CM | POA: Diagnosis not present

## 2020-10-11 DIAGNOSIS — E782 Mixed hyperlipidemia: Secondary | ICD-10-CM | POA: Diagnosis not present

## 2020-10-11 DIAGNOSIS — E7800 Pure hypercholesterolemia, unspecified: Secondary | ICD-10-CM | POA: Diagnosis not present

## 2020-10-20 ENCOUNTER — Other Ambulatory Visit: Payer: Self-pay

## 2020-10-20 ENCOUNTER — Ambulatory Visit: Payer: Medicare Other | Admitting: *Deleted

## 2020-10-20 DIAGNOSIS — I4821 Permanent atrial fibrillation: Secondary | ICD-10-CM

## 2020-10-20 DIAGNOSIS — Z5181 Encounter for therapeutic drug level monitoring: Secondary | ICD-10-CM

## 2020-10-20 LAB — POCT INR: INR: 3.4 — AB (ref 2.0–3.0)

## 2020-10-20 NOTE — Patient Instructions (Signed)
Description   Do not take any Warfarin tonight then continue taking Warfarin 1/2 tablet daily except 1 tablet on Mondays and Fridays. Recheck INR in 4 weeks. Call if you have any concerns or placed on any new medications [phone].

## 2020-10-30 DIAGNOSIS — G4733 Obstructive sleep apnea (adult) (pediatric): Secondary | ICD-10-CM | POA: Diagnosis not present

## 2020-11-14 DIAGNOSIS — M1990 Unspecified osteoarthritis, unspecified site: Secondary | ICD-10-CM | POA: Diagnosis not present

## 2020-11-14 DIAGNOSIS — M179 Osteoarthritis of knee, unspecified: Secondary | ICD-10-CM | POA: Diagnosis not present

## 2020-11-14 DIAGNOSIS — E782 Mixed hyperlipidemia: Secondary | ICD-10-CM | POA: Diagnosis not present

## 2020-11-14 DIAGNOSIS — E7800 Pure hypercholesterolemia, unspecified: Secondary | ICD-10-CM | POA: Diagnosis not present

## 2020-11-17 DIAGNOSIS — H4042X1 Glaucoma secondary to eye inflammation, left eye, mild stage: Secondary | ICD-10-CM | POA: Diagnosis not present

## 2020-11-17 DIAGNOSIS — T8579XS Infection and inflammatory reaction due to other internal prosthetic devices, implants and grafts, sequela: Secondary | ICD-10-CM | POA: Diagnosis not present

## 2020-11-17 DIAGNOSIS — Z961 Presence of intraocular lens: Secondary | ICD-10-CM | POA: Diagnosis not present

## 2020-11-17 DIAGNOSIS — H43811 Vitreous degeneration, right eye: Secondary | ICD-10-CM | POA: Diagnosis not present

## 2020-11-19 ENCOUNTER — Ambulatory Visit: Payer: Medicare Other | Admitting: *Deleted

## 2020-11-19 ENCOUNTER — Other Ambulatory Visit: Payer: Self-pay

## 2020-11-19 DIAGNOSIS — Z5181 Encounter for therapeutic drug level monitoring: Secondary | ICD-10-CM | POA: Diagnosis not present

## 2020-11-19 DIAGNOSIS — I4821 Permanent atrial fibrillation: Secondary | ICD-10-CM

## 2020-11-19 LAB — POCT INR: INR: 2.4 (ref 2.0–3.0)

## 2020-11-19 NOTE — Patient Instructions (Signed)
Description   Continue taking Warfarin 1/2 tablet daily except 1 tablet on Mondays and Fridays. Recheck INR in 6 weeks. Call if you have any concerns or placed on any new medications [phone].

## 2020-12-07 DIAGNOSIS — M25562 Pain in left knee: Secondary | ICD-10-CM | POA: Diagnosis not present

## 2020-12-07 DIAGNOSIS — M25561 Pain in right knee: Secondary | ICD-10-CM | POA: Diagnosis not present

## 2020-12-15 DIAGNOSIS — M19071 Primary osteoarthritis, right ankle and foot: Secondary | ICD-10-CM | POA: Diagnosis not present

## 2020-12-21 ENCOUNTER — Other Ambulatory Visit: Payer: Self-pay | Admitting: Interventional Cardiology

## 2020-12-31 ENCOUNTER — Other Ambulatory Visit: Payer: Self-pay

## 2020-12-31 ENCOUNTER — Ambulatory Visit: Payer: Medicare Other | Admitting: *Deleted

## 2020-12-31 DIAGNOSIS — Z5181 Encounter for therapeutic drug level monitoring: Secondary | ICD-10-CM | POA: Diagnosis not present

## 2020-12-31 DIAGNOSIS — I4821 Permanent atrial fibrillation: Secondary | ICD-10-CM | POA: Diagnosis not present

## 2020-12-31 LAB — POCT INR: INR: 3 (ref 2.0–3.0)

## 2020-12-31 NOTE — Patient Instructions (Signed)
Description   Continue taking Warfarin 1/2 tablet daily except 1 tablet on Mondays and Fridays. Recheck INR in 6 weeks. Have some leafy veggies today and remain consistent. Call if you have any concerns or placed on any new medications [phone].

## 2021-01-01 ENCOUNTER — Other Ambulatory Visit: Payer: Self-pay | Admitting: Interventional Cardiology

## 2021-01-12 ENCOUNTER — Other Ambulatory Visit: Payer: Self-pay | Admitting: Interventional Cardiology

## 2021-01-12 DIAGNOSIS — D225 Melanocytic nevi of trunk: Secondary | ICD-10-CM | POA: Diagnosis not present

## 2021-01-12 DIAGNOSIS — L814 Other melanin hyperpigmentation: Secondary | ICD-10-CM | POA: Diagnosis not present

## 2021-01-12 DIAGNOSIS — L821 Other seborrheic keratosis: Secondary | ICD-10-CM | POA: Diagnosis not present

## 2021-01-12 DIAGNOSIS — L720 Epidermal cyst: Secondary | ICD-10-CM | POA: Diagnosis not present

## 2021-01-13 DIAGNOSIS — H401131 Primary open-angle glaucoma, bilateral, mild stage: Secondary | ICD-10-CM | POA: Diagnosis not present

## 2021-01-13 DIAGNOSIS — H524 Presbyopia: Secondary | ICD-10-CM | POA: Diagnosis not present

## 2021-01-18 DIAGNOSIS — R7303 Prediabetes: Secondary | ICD-10-CM | POA: Diagnosis not present

## 2021-01-18 DIAGNOSIS — I482 Chronic atrial fibrillation, unspecified: Secondary | ICD-10-CM | POA: Diagnosis not present

## 2021-01-18 DIAGNOSIS — Z0000 Encounter for general adult medical examination without abnormal findings: Secondary | ICD-10-CM | POA: Diagnosis not present

## 2021-01-18 DIAGNOSIS — M179 Osteoarthritis of knee, unspecified: Secondary | ICD-10-CM | POA: Diagnosis not present

## 2021-01-18 DIAGNOSIS — E782 Mixed hyperlipidemia: Secondary | ICD-10-CM | POA: Diagnosis not present

## 2021-01-18 DIAGNOSIS — Z1389 Encounter for screening for other disorder: Secondary | ICD-10-CM | POA: Diagnosis not present

## 2021-02-07 ENCOUNTER — Telehealth: Payer: Self-pay

## 2021-02-07 MED ORDER — DILTIAZEM HCL ER 240 MG PO CP24: 240.0000 mg | ORAL_CAPSULE | Freq: Every day | ORAL | 0 refills | Status: DC

## 2021-02-07 NOTE — Telephone Encounter (Signed)
Pt has an appt in February 2023 with Richardson Dopp, PA. Pt has not been seen since 2021. Would Dr. Irish Lack like to refill this medication until appt time or does pt need to be seen sooner? Please address

## 2021-02-07 NOTE — Telephone Encounter (Signed)
Rx(s) sent to pharmacy electronically.  Patient made aware.

## 2021-02-07 NOTE — Telephone Encounter (Signed)
Yes, please refill up until that appt date.  Thanks!

## 2021-02-07 NOTE — Telephone Encounter (Signed)
*  STAT* If patient is at the pharmacy, call can be transferred to refill team.   1. Which medications need to be refilled? (please list name of each medication and dose if known) diltiazem (DILT-XR) 240 MG 24 hr capsule  2. Which pharmacy/location (including street and city if local pharmacy) is medication to be sent to? [facility] on file  3. Do they need a 30 day or 90 day supply? 90 day    Patient is scheduled for next available OV Richardson Dopp on 05/10/2021) and needs enough medication to get him until then.

## 2021-02-11 ENCOUNTER — Ambulatory Visit: Payer: Medicare Other

## 2021-02-11 ENCOUNTER — Other Ambulatory Visit: Payer: Self-pay

## 2021-02-11 DIAGNOSIS — Z5181 Encounter for therapeutic drug level monitoring: Secondary | ICD-10-CM

## 2021-02-11 LAB — POCT INR: INR: 2 (ref 2.0–3.0)

## 2021-02-11 NOTE — Patient Instructions (Signed)
Description   Take 1.5 tablets today and then continue taking Warfarin 1/2 tablet daily except 1 tablet on Mondays and Fridays. Recheck INR in 6 weeks. Have some leafy veggies today and remain consistent. Call if you have any concerns or placed on any new medications [phone].

## 2021-02-15 DIAGNOSIS — L309 Dermatitis, unspecified: Secondary | ICD-10-CM | POA: Diagnosis not present

## 2021-02-15 DIAGNOSIS — L249 Irritant contact dermatitis, unspecified cause: Secondary | ICD-10-CM | POA: Diagnosis not present

## 2021-02-18 DIAGNOSIS — L309 Dermatitis, unspecified: Secondary | ICD-10-CM | POA: Diagnosis not present

## 2021-03-01 DIAGNOSIS — L309 Dermatitis, unspecified: Secondary | ICD-10-CM | POA: Diagnosis not present

## 2021-03-01 DIAGNOSIS — B9689 Other specified bacterial agents as the cause of diseases classified elsewhere: Secondary | ICD-10-CM | POA: Diagnosis not present

## 2021-03-02 DIAGNOSIS — G4733 Obstructive sleep apnea (adult) (pediatric): Secondary | ICD-10-CM | POA: Diagnosis not present

## 2021-03-15 ENCOUNTER — Other Ambulatory Visit: Payer: Self-pay | Admitting: Interventional Cardiology

## 2021-03-15 DIAGNOSIS — I4891 Unspecified atrial fibrillation: Secondary | ICD-10-CM

## 2021-03-30 ENCOUNTER — Other Ambulatory Visit: Payer: Self-pay

## 2021-03-30 ENCOUNTER — Ambulatory Visit: Payer: Medicare Other | Admitting: *Deleted

## 2021-03-30 DIAGNOSIS — Z5181 Encounter for therapeutic drug level monitoring: Secondary | ICD-10-CM | POA: Diagnosis not present

## 2021-03-30 DIAGNOSIS — I4821 Permanent atrial fibrillation: Secondary | ICD-10-CM | POA: Diagnosis not present

## 2021-03-30 LAB — POCT INR: INR: 3 (ref 2.0–3.0)

## 2021-03-30 NOTE — Patient Instructions (Signed)
Description   Continue taking Warfarin 1/2 tablet daily except 1 tablet on Mondays and Fridays. Recheck INR in 6 weeks. Have some leafy veggies today and remain consistent. Call if you have any concerns or placed on any new medications [phone].

## 2021-04-01 DIAGNOSIS — G4733 Obstructive sleep apnea (adult) (pediatric): Secondary | ICD-10-CM | POA: Diagnosis not present

## 2021-05-02 DIAGNOSIS — G4733 Obstructive sleep apnea (adult) (pediatric): Secondary | ICD-10-CM | POA: Diagnosis not present

## 2021-05-04 DIAGNOSIS — L570 Actinic keratosis: Secondary | ICD-10-CM | POA: Diagnosis not present

## 2021-05-04 DIAGNOSIS — B9689 Other specified bacterial agents as the cause of diseases classified elsewhere: Secondary | ICD-10-CM | POA: Diagnosis not present

## 2021-05-04 DIAGNOSIS — L309 Dermatitis, unspecified: Secondary | ICD-10-CM | POA: Diagnosis not present

## 2021-05-09 NOTE — Progress Notes (Signed)
Cardiology Office Note:    Date:  05/10/2021   ID:  Travis May, DOB February 12, 1933, MRN [id number]  PCP:  Wenda Low, MD  [facility] Providers Cardiologist:  Larae Grooms, MD     Referring MD: Wenda Low, MD   Chief Complaint:  F/u for AFib    Patient Profile: Permanent atrial fibrillation  Rx: Warfarin  Mild aortic insufficiency (Echocardiogram 2014) Diabetes mellitus  Hyperlipidemia  OSA   Prior CV Studies: Echocardiogram 08/30/12 EF 60-65, mild asymmetrical septal LVH, BAE, mild AI  History of Present Illness:   Travis May is a 88 y.o. male with the above problem list.  He was last seen by Dr. Irish Lack in 7/21.  He returns for f/u.  He is here alone.  He continues to work out at the [employer] at [employer] 3 days a week.  He has a lot of issues with knee and ankle arthritis which limits his mobility.  He has not had chest pain, shortness of breath, syncope, orthopnea.  He sleeps with CPAP.  He has chronic lower extremity edema which improves with elevation.        Past Medical History:  Diagnosis Date   Arthritis    Coronary artery disease    Dysrhythmia    Atrial Fibrillation   Sleep apnea    CPAP   Current Medications: Current Meds  Medication Sig   b complex vitamins tablet Take 1 tablet by mouth daily.   cholecalciferol (VITAMIN D) 1000 UNITS tablet Take 1,000 Units by mouth daily before breakfast.   GLUCOSAMINE-CHONDROITIN PO Take 1,200 mg by mouth 2 (two) times daily. 1200-1400mg  tablet   latanoprost (XALATAN) 0.005 % ophthalmic solution Place 1 drop into both eyes at bedtime.   Magnesium 250 MG TABS Take 1 tablet by mouth daily.   metFORMIN (GLUCOPHAGE) 500 MG tablet Take 1 tablet by mouth daily.   simvastatin (ZOCOR) 10 MG tablet TAKE 1 TABLET BY MOUTH DAILY BEFORE BREAKFAST   sodium chloride (OCEAN) 0.65 % nasal spray Place 1 spray into the nose daily as needed for congestion.   Turmeric 450 MG CAPS Take by mouth daily.    warfarin (COUMADIN) 5 MG tablet TAKE 1/2 TO 1 TABLET BY MOUTH AS DIRECTED BY THE COUMADIN CLINIC   [DISCONTINUED] diltiazem (DILT-XR) 240 MG 24 hr capsule Take 1 capsule (240 mg total) by mouth daily. ON AN EMPTY STOMACH.    Allergies:   Elemental sulfur and Sulfa antibiotics   Social History   Tobacco Use   Smoking status: Former    Types: Cigarettes    Quit date: 08/13/1963    Years since quitting: 57.7   Smokeless tobacco: Never  Vaping Use   Vaping Use: Never used  Substance Use Topics   Alcohol use: Yes    Alcohol/week: 1.0 - 2.0 standard drink    Types: 1 - 2 Cans of beer per week    Comment: daily-1-2 beers   Drug use: No    Family Hx: The patient's family history includes Lung cancer in his father. There is no history of Heart attack, Stroke, or Hypertension.  Review of Systems  Gastrointestinal:  Negative for hematochezia.  Genitourinary:  Negative for hematuria.    EKGs/Labs/Other Test Reviewed:    EKG:  EKG is  ordered today.  The ekg ordered today demonstrates atrial fibrillation, HR 75, normal axis, no ST-T wave changes, QTc 437  Recent Labs: No results found for requested labs within last 8760 hours.   Recent  Lipid Panel No results for input(s): CHOL, TRIG, HDL, VLDL, LDLCALC, LDLDIRECT in the last 8760 hours.   Risk Assessment/Calculations:    CHA2DS2-VASc Score = 2   This indicates a 2.2% annual risk of stroke. The patient's score is based upon: CHF History: 0 HTN History: 0 Diabetes History: 0 Stroke History: 0 Vascular Disease History: 0 Age Score: 2 Gender Score: 0        Physical Exam:    VS:  BP 132/60 (BP Location: Right Arm, Patient Position: Sitting, Cuff Size: Normal)    Pulse 75    Ht 5\' 11"  (1.803 m)    Wt 220 lb 6.4 oz (100 kg)    BMI 30.74 kg/m     Wt Readings from Last 3 Encounters:  05/10/21 220 lb 6.4 oz (100 kg)  10/28/19 (!) 224 lb 3.2 oz (101.7 kg)  08/21/18 212 lb (96.2 kg)    Constitutional:      Appearance: Healthy  appearance. Not in distress.  Neck:     Vascular: No JVR. JVD normal.  Pulmonary:     Effort: Pulmonary effort is normal.     Breath sounds: No wheezing. No rales.  Cardiovascular:     Normal rate. Irregularly irregular rhythm. Normal S1. Normal S2.      Murmurs: There is no murmur.  Edema:    Peripheral edema present.    Ankle: bilateral 1+ edema of the ankle. Abdominal:     Palpations: Abdomen is soft.  Skin:    General: Skin is warm and dry.  Neurological:     General: No focal deficit present.     Mental Status: Alert and oriented to person, place and time.     Cranial Nerves: Cranial nerves are intact.         ASSESSMENT & PLAN:   Atrial fibrillation Permanent atrial fibrillation.  His rate is controlled.  His blood pressure is well controlled.  He remains on diltiazem to 40 mg daily.  Coumadin is managed in our Coumadin clinic.  Follow-up in 1 year.          Dispo:  Return in about 1 year (around 05/10/2022) for Routine Follow Up w/ Dr. Irish Lack.   Medication Adjustments/Labs and Tests Ordered: Current medicines are reviewed at length with the patient today.  Concerns regarding medicines are outlined above.  Tests Ordered: Orders Placed This Encounter  Procedures   EKG 12-Lead   Medication Changes: Meds ordered this encounter  Medications   diltiazem (DILT-XR) 240 MG 24 hr capsule    Sig: Take 1 capsule (240 mg total) by mouth daily. ON AN EMPTY STOMACH.    Dispense:  90 capsule    Refill:  3   Signed, Travis Dopp, PA-C  05/10/2021 8:48 AM    [facility] [place], [place], [place]  [address] Phone: [phone]; Fax: [phone]

## 2021-05-10 ENCOUNTER — Other Ambulatory Visit: Payer: Self-pay

## 2021-05-10 ENCOUNTER — Encounter: Payer: Self-pay | Admitting: Physician Assistant

## 2021-05-10 ENCOUNTER — Ambulatory Visit: Payer: Medicare Other | Admitting: Physician Assistant

## 2021-05-10 ENCOUNTER — Ambulatory Visit (INDEPENDENT_AMBULATORY_CARE_PROVIDER_SITE_OTHER): Payer: Medicare Other | Admitting: *Deleted

## 2021-05-10 VITALS — BP 132/60 | HR 75 | Ht 71.0 in | Wt 220.4 lb

## 2021-05-10 DIAGNOSIS — D6869 Other thrombophilia: Secondary | ICD-10-CM | POA: Diagnosis not present

## 2021-05-10 DIAGNOSIS — I4821 Permanent atrial fibrillation: Secondary | ICD-10-CM

## 2021-05-10 DIAGNOSIS — E782 Mixed hyperlipidemia: Secondary | ICD-10-CM

## 2021-05-10 DIAGNOSIS — Z5181 Encounter for therapeutic drug level monitoring: Secondary | ICD-10-CM

## 2021-05-10 DIAGNOSIS — I359 Nonrheumatic aortic valve disorder, unspecified: Secondary | ICD-10-CM

## 2021-05-10 LAB — POCT INR: INR: 2.3 (ref 2.0–3.0)

## 2021-05-10 MED ORDER — DILTIAZEM HCL ER 240 MG PO CP24: 240.0000 mg | ORAL_CAPSULE | Freq: Every day | ORAL | 3 refills | Status: DC

## 2021-05-10 NOTE — Assessment & Plan Note (Signed)
Permanent atrial fibrillation.  His rate is controlled.  His blood pressure is well controlled.  He remains on diltiazem to 40 mg daily.  Coumadin is managed in our Coumadin clinic.  Follow-up in 1 year.

## 2021-05-10 NOTE — Patient Instructions (Signed)
Description   Continue taking Warfarin 1/2 tablet daily except 1 tablet on Mondays and Fridays. Recheck INR in 6 weeks. Remain consistent with leafy veggies. Call if you have any concerns or placed on any new medications [phone].

## 2021-05-10 NOTE — Patient Instructions (Signed)
Medication Instructions:   Your physician recommends that you continue on your current medications as directed. Please refer to the Current Medication list given to you today.  *If you need a refill on your cardiac medications before your next appointment, please call your pharmacy*   Lab Work:  None ordered.  If you have labs (blood work) drawn today and your tests are completely normal, you will receive your results only by: [place] (if you have MyChart) OR A paper copy in the mail If you have any lab test that is abnormal or we need to change your treatment, we will call you to review the results.   Testing/Procedures:  None ordered.   Follow-Up: At [facility], you and your health needs are our priority.  As part of our continuing mission to provide you with exceptional heart care, we have created designated Provider Care Teams.  These Care Teams include your primary Cardiologist (physician) and Advanced Practice Providers (APPs -  Physician Assistants and Nurse Practitioners) who all work together to provide you with the care you need, when you need it.  We recommend signing up for the patient portal called "MyChart".  Sign up information is provided on this After Visit Summary.  MyChart is used to connect with patients for Virtual Visits (Telemedicine).  Patients are able to view lab/test results, encounter notes, upcoming appointments, etc.  Non-urgent messages can be sent to your provider as well.   To learn more about what you can do with MyChart, go to [email or web address].    Your next appointment:   1 year(s)  The format for your next appointment:   In Person  Provider:   Larae Grooms, MD     Other Instructions  Your physician wants you to follow-up in: 1 year with Dr. Irish Lack.  You will receive a reminder letter in the mail two months in advance. If you don't receive a letter, please call our office to schedule the follow-up appointment.

## 2021-06-13 DIAGNOSIS — D6869 Other thrombophilia: Secondary | ICD-10-CM | POA: Diagnosis not present

## 2021-06-13 DIAGNOSIS — E782 Mixed hyperlipidemia: Secondary | ICD-10-CM | POA: Diagnosis not present

## 2021-06-13 DIAGNOSIS — I482 Chronic atrial fibrillation, unspecified: Secondary | ICD-10-CM | POA: Diagnosis not present

## 2021-06-13 DIAGNOSIS — R7303 Prediabetes: Secondary | ICD-10-CM | POA: Diagnosis not present

## 2021-06-22 ENCOUNTER — Ambulatory Visit: Payer: Medicare Other

## 2021-06-22 ENCOUNTER — Other Ambulatory Visit: Payer: Self-pay

## 2021-06-22 DIAGNOSIS — Z5181 Encounter for therapeutic drug level monitoring: Secondary | ICD-10-CM | POA: Diagnosis not present

## 2021-06-22 DIAGNOSIS — I4821 Permanent atrial fibrillation: Secondary | ICD-10-CM | POA: Diagnosis not present

## 2021-06-22 LAB — POCT INR: INR: 2.8 (ref 2.0–3.0)

## 2021-06-22 NOTE — Patient Instructions (Signed)
Description   ?Continue taking Warfarin 1/2 tablet daily except 1 tablet on Mondays and Fridays. Recheck INR in 6 weeks. Remain consistent with leafy veggies. Call if you have any concerns or placed on any new medications [phone]. ?  ?   ?

## 2021-07-26 DIAGNOSIS — M25562 Pain in left knee: Secondary | ICD-10-CM | POA: Diagnosis not present

## 2021-07-26 DIAGNOSIS — M25561 Pain in right knee: Secondary | ICD-10-CM | POA: Diagnosis not present

## 2021-08-01 DIAGNOSIS — R7303 Prediabetes: Secondary | ICD-10-CM | POA: Diagnosis not present

## 2021-08-01 DIAGNOSIS — I482 Chronic atrial fibrillation, unspecified: Secondary | ICD-10-CM | POA: Diagnosis not present

## 2021-08-01 DIAGNOSIS — G4733 Obstructive sleep apnea (adult) (pediatric): Secondary | ICD-10-CM | POA: Diagnosis not present

## 2021-08-01 DIAGNOSIS — E782 Mixed hyperlipidemia: Secondary | ICD-10-CM | POA: Diagnosis not present

## 2021-08-03 ENCOUNTER — Ambulatory Visit: Payer: Medicare Other

## 2021-08-03 DIAGNOSIS — I4821 Permanent atrial fibrillation: Secondary | ICD-10-CM

## 2021-08-03 DIAGNOSIS — Z5181 Encounter for therapeutic drug level monitoring: Secondary | ICD-10-CM

## 2021-08-03 LAB — POCT INR: INR: 1.9 — AB (ref 2.0–3.0)

## 2021-08-03 NOTE — Patient Instructions (Signed)
Description   ?Take 1 tablet today and then continue taking Warfarin 1/2 tablet daily except 1 tablet on Mondays and Fridays.  ?Recheck INR in 6 weeks.  ?Remain consistent with leafy veggies.  ?Call if you have any concerns or placed on any new medications [phone]. ?  ?   ?

## 2021-08-19 DIAGNOSIS — T8579XS Infection and inflammatory reaction due to other internal prosthetic devices, implants and grafts, sequela: Secondary | ICD-10-CM | POA: Diagnosis not present

## 2021-08-19 DIAGNOSIS — H4042X1 Glaucoma secondary to eye inflammation, left eye, mild stage: Secondary | ICD-10-CM | POA: Diagnosis not present

## 2021-08-19 DIAGNOSIS — H43811 Vitreous degeneration, right eye: Secondary | ICD-10-CM | POA: Diagnosis not present

## 2021-08-19 DIAGNOSIS — Z961 Presence of intraocular lens: Secondary | ICD-10-CM | POA: Diagnosis not present

## 2021-09-14 ENCOUNTER — Ambulatory Visit: Payer: Medicare Other | Admitting: *Deleted

## 2021-09-14 DIAGNOSIS — I4821 Permanent atrial fibrillation: Secondary | ICD-10-CM

## 2021-09-14 DIAGNOSIS — Z5181 Encounter for therapeutic drug level monitoring: Secondary | ICD-10-CM | POA: Diagnosis not present

## 2021-09-14 LAB — POCT INR: INR: 3.2 — AB (ref 2.0–3.0)

## 2021-09-14 NOTE — Patient Instructions (Signed)
Description   Hold today's dose then continue taking Warfarin 1/2 tablet daily except 1 tablet on Mondays and Fridays. Recheck INR in 5 weeks. Remain consistent with leafy veggies. Call if you have any concerns or placed on any new medications [phone].

## 2021-09-28 ENCOUNTER — Other Ambulatory Visit: Payer: Self-pay | Admitting: Interventional Cardiology

## 2021-09-28 DIAGNOSIS — R7303 Prediabetes: Secondary | ICD-10-CM | POA: Diagnosis not present

## 2021-09-28 DIAGNOSIS — M179 Osteoarthritis of knee, unspecified: Secondary | ICD-10-CM | POA: Diagnosis not present

## 2021-09-28 DIAGNOSIS — I4891 Unspecified atrial fibrillation: Secondary | ICD-10-CM

## 2021-09-28 DIAGNOSIS — I482 Chronic atrial fibrillation, unspecified: Secondary | ICD-10-CM | POA: Diagnosis not present

## 2021-09-28 DIAGNOSIS — Z01818 Encounter for other preprocedural examination: Secondary | ICD-10-CM | POA: Diagnosis not present

## 2021-10-07 DIAGNOSIS — G4733 Obstructive sleep apnea (adult) (pediatric): Secondary | ICD-10-CM | POA: Diagnosis not present

## 2021-10-21 ENCOUNTER — Ambulatory Visit: Payer: Medicare Other

## 2021-10-21 DIAGNOSIS — Z5181 Encounter for therapeutic drug level monitoring: Secondary | ICD-10-CM | POA: Diagnosis not present

## 2021-10-21 LAB — POCT INR: INR: 2.5 (ref 2.0–3.0)

## 2021-10-21 NOTE — Patient Instructions (Signed)
continue taking Warfarin 1/2 tablet daily except 1 tablet on Mondays and Fridays. Recheck INR in 6 weeks. Remain consistent with leafy veggies. Call if you have any concerns or placed on any new medications [phone].

## 2021-11-07 DIAGNOSIS — G4733 Obstructive sleep apnea (adult) (pediatric): Secondary | ICD-10-CM | POA: Diagnosis not present

## 2021-12-02 ENCOUNTER — Ambulatory Visit: Payer: Medicare Other | Attending: Interventional Cardiology

## 2021-12-02 DIAGNOSIS — I4821 Permanent atrial fibrillation: Secondary | ICD-10-CM

## 2021-12-02 DIAGNOSIS — Z5181 Encounter for therapeutic drug level monitoring: Secondary | ICD-10-CM

## 2021-12-02 LAB — POCT INR: INR: 2.4 (ref 2.0–3.0)

## 2021-12-02 NOTE — Patient Instructions (Signed)
Description   Continue taking Warfarin 1/2 tablet daily except 1 tablet on Mondays and Fridays. Recheck INR in 7 weeks. Remain consistent with leafy veggies. Call if you have any concerns or placed on any new medications [phone].

## 2021-12-08 DIAGNOSIS — Z7901 Long term (current) use of anticoagulants: Secondary | ICD-10-CM | POA: Diagnosis not present

## 2021-12-08 DIAGNOSIS — L989 Disorder of the skin and subcutaneous tissue, unspecified: Secondary | ICD-10-CM | POA: Diagnosis not present

## 2021-12-08 DIAGNOSIS — G4733 Obstructive sleep apnea (adult) (pediatric): Secondary | ICD-10-CM | POA: Diagnosis not present

## 2021-12-16 DIAGNOSIS — Z7901 Long term (current) use of anticoagulants: Secondary | ICD-10-CM | POA: Diagnosis not present

## 2021-12-20 DIAGNOSIS — L989 Disorder of the skin and subcutaneous tissue, unspecified: Secondary | ICD-10-CM | POA: Diagnosis not present

## 2021-12-20 DIAGNOSIS — L299 Pruritus, unspecified: Secondary | ICD-10-CM | POA: Diagnosis not present

## 2021-12-20 DIAGNOSIS — D539 Nutritional anemia, unspecified: Secondary | ICD-10-CM | POA: Diagnosis not present

## 2021-12-23 ENCOUNTER — Other Ambulatory Visit: Payer: Self-pay | Admitting: Interventional Cardiology

## 2021-12-28 DIAGNOSIS — R350 Frequency of micturition: Secondary | ICD-10-CM | POA: Diagnosis not present

## 2021-12-28 DIAGNOSIS — R300 Dysuria: Secondary | ICD-10-CM | POA: Diagnosis not present

## 2022-01-04 DIAGNOSIS — N390 Urinary tract infection, site not specified: Secondary | ICD-10-CM | POA: Diagnosis not present

## 2022-01-04 DIAGNOSIS — D539 Nutritional anemia, unspecified: Secondary | ICD-10-CM | POA: Diagnosis not present

## 2022-01-04 DIAGNOSIS — Z7901 Long term (current) use of anticoagulants: Secondary | ICD-10-CM | POA: Diagnosis not present

## 2022-01-09 DIAGNOSIS — D225 Melanocytic nevi of trunk: Secondary | ICD-10-CM | POA: Diagnosis not present

## 2022-01-09 DIAGNOSIS — L814 Other melanin hyperpigmentation: Secondary | ICD-10-CM | POA: Diagnosis not present

## 2022-01-09 DIAGNOSIS — L821 Other seborrheic keratosis: Secondary | ICD-10-CM | POA: Diagnosis not present

## 2022-01-09 DIAGNOSIS — L308 Other specified dermatitis: Secondary | ICD-10-CM | POA: Diagnosis not present

## 2022-01-18 DIAGNOSIS — Z7901 Long term (current) use of anticoagulants: Secondary | ICD-10-CM | POA: Diagnosis not present

## 2022-01-20 ENCOUNTER — Ambulatory Visit: Payer: Medicare Other | Attending: Cardiovascular Disease | Admitting: *Deleted

## 2022-01-20 DIAGNOSIS — Z23 Encounter for immunization: Secondary | ICD-10-CM | POA: Diagnosis not present

## 2022-01-20 DIAGNOSIS — Z5181 Encounter for therapeutic drug level monitoring: Secondary | ICD-10-CM

## 2022-01-20 DIAGNOSIS — R7309 Other abnormal glucose: Secondary | ICD-10-CM | POA: Diagnosis not present

## 2022-01-20 DIAGNOSIS — I4821 Permanent atrial fibrillation: Secondary | ICD-10-CM | POA: Diagnosis not present

## 2022-01-20 DIAGNOSIS — Z1331 Encounter for screening for depression: Secondary | ICD-10-CM | POA: Diagnosis not present

## 2022-01-20 DIAGNOSIS — E782 Mixed hyperlipidemia: Secondary | ICD-10-CM | POA: Diagnosis not present

## 2022-01-20 DIAGNOSIS — Z0000 Encounter for general adult medical examination without abnormal findings: Secondary | ICD-10-CM | POA: Diagnosis not present

## 2022-01-20 DIAGNOSIS — R7303 Prediabetes: Secondary | ICD-10-CM | POA: Diagnosis not present

## 2022-01-20 DIAGNOSIS — G4733 Obstructive sleep apnea (adult) (pediatric): Secondary | ICD-10-CM | POA: Diagnosis not present

## 2022-01-20 DIAGNOSIS — D6869 Other thrombophilia: Secondary | ICD-10-CM | POA: Diagnosis not present

## 2022-01-20 LAB — POCT INR: POC INR: 2.2

## 2022-01-20 NOTE — Patient Instructions (Signed)
Description   Continue taking Warfarin 1/2 tablet daily except 1 tablet on Mondays and Fridays. Recheck INR in 7 weeks. Remain consistent with leafy veggies. Call if you have any concerns or placed on any new medications [phone].

## 2022-02-10 DIAGNOSIS — H5213 Myopia, bilateral: Secondary | ICD-10-CM | POA: Diagnosis not present

## 2022-02-15 DIAGNOSIS — Z7901 Long term (current) use of anticoagulants: Secondary | ICD-10-CM | POA: Diagnosis not present

## 2022-02-17 DIAGNOSIS — H4042X1 Glaucoma secondary to eye inflammation, left eye, mild stage: Secondary | ICD-10-CM | POA: Diagnosis not present

## 2022-02-17 DIAGNOSIS — Z961 Presence of intraocular lens: Secondary | ICD-10-CM | POA: Diagnosis not present

## 2022-02-17 DIAGNOSIS — H43811 Vitreous degeneration, right eye: Secondary | ICD-10-CM | POA: Diagnosis not present

## 2022-02-17 DIAGNOSIS — T8579XS Infection and inflammatory reaction due to other internal prosthetic devices, implants and grafts, sequela: Secondary | ICD-10-CM | POA: Diagnosis not present

## 2022-02-20 ENCOUNTER — Ambulatory Visit (INDEPENDENT_AMBULATORY_CARE_PROVIDER_SITE_OTHER): Payer: Medicare Other

## 2022-02-20 DIAGNOSIS — Z5181 Encounter for therapeutic drug level monitoring: Secondary | ICD-10-CM | POA: Diagnosis not present

## 2022-02-20 DIAGNOSIS — I4821 Permanent atrial fibrillation: Secondary | ICD-10-CM | POA: Diagnosis not present

## 2022-02-20 LAB — POCT INR: INR: 3 (ref 2.0–3.0)

## 2022-02-20 NOTE — Patient Instructions (Signed)
Description   Continue taking Warfarin 1/2 tablet daily except 1 tablet on Mondays and Fridays. Recheck INR in 7 weeks. Remain consistent with leafy veggies. Call if you have any concerns or placed on any new medications [phone].     

## 2022-02-22 DIAGNOSIS — L111 Transient acantholytic dermatosis [Grover]: Secondary | ICD-10-CM | POA: Diagnosis not present

## 2022-02-22 DIAGNOSIS — S80862A Insect bite (nonvenomous), left lower leg, initial encounter: Secondary | ICD-10-CM | POA: Diagnosis not present

## 2022-02-22 DIAGNOSIS — S80861A Insect bite (nonvenomous), right lower leg, initial encounter: Secondary | ICD-10-CM | POA: Diagnosis not present

## 2022-02-22 DIAGNOSIS — L728 Other follicular cysts of the skin and subcutaneous tissue: Secondary | ICD-10-CM | POA: Diagnosis not present

## 2022-02-28 ENCOUNTER — Other Ambulatory Visit: Payer: Self-pay | Admitting: *Deleted

## 2022-02-28 MED ORDER — SIMVASTATIN 10 MG PO TABS: 10.0000 mg | ORAL_TABLET | Freq: Every day | ORAL | 0 refills | Status: DC

## 2022-03-15 DIAGNOSIS — Z7901 Long term (current) use of anticoagulants: Secondary | ICD-10-CM | POA: Diagnosis not present

## 2022-04-05 DIAGNOSIS — L570 Actinic keratosis: Secondary | ICD-10-CM | POA: Diagnosis not present

## 2022-04-06 ENCOUNTER — Other Ambulatory Visit: Payer: Self-pay | Admitting: Physician Assistant

## 2022-04-06 ENCOUNTER — Other Ambulatory Visit: Payer: Self-pay | Admitting: Interventional Cardiology

## 2022-04-06 DIAGNOSIS — I4891 Unspecified atrial fibrillation: Secondary | ICD-10-CM

## 2022-04-06 NOTE — Telephone Encounter (Signed)
Last OV 05/10/2021 Last INR 02/20/2022 Warfarin refill sent

## 2022-04-10 ENCOUNTER — Ambulatory Visit: Payer: BLUE CROSS/BLUE SHIELD | Attending: Cardiology

## 2022-04-10 DIAGNOSIS — Z5181 Encounter for therapeutic drug level monitoring: Secondary | ICD-10-CM | POA: Diagnosis not present

## 2022-04-10 LAB — POCT INR: INR: 2.4 (ref 2.0–3.0)

## 2022-04-10 NOTE — Patient Instructions (Signed)
Continue taking Warfarin 1/2 tablet daily except 1 tablet on Mondays and Fridays. Recheck INR in 7 weeks. Remain consistent with leafy veggies. Call if you have any concerns or placed on any new medications [phone].

## 2022-05-17 DIAGNOSIS — Z9989 Dependence on other enabling machines and devices: Secondary | ICD-10-CM | POA: Diagnosis not present

## 2022-05-17 DIAGNOSIS — E1169 Type 2 diabetes mellitus with other specified complication: Secondary | ICD-10-CM | POA: Diagnosis not present

## 2022-05-17 DIAGNOSIS — D6869 Other thrombophilia: Secondary | ICD-10-CM | POA: Diagnosis not present

## 2022-05-17 DIAGNOSIS — I4821 Permanent atrial fibrillation: Secondary | ICD-10-CM | POA: Diagnosis not present

## 2022-05-29 ENCOUNTER — Ambulatory Visit: Payer: Medicare Other | Attending: Cardiovascular Disease | Admitting: *Deleted

## 2022-05-29 DIAGNOSIS — Z5181 Encounter for therapeutic drug level monitoring: Secondary | ICD-10-CM | POA: Diagnosis not present

## 2022-05-29 DIAGNOSIS — I4821 Permanent atrial fibrillation: Secondary | ICD-10-CM | POA: Diagnosis not present

## 2022-05-29 LAB — POCT INR: POC INR: 2.9

## 2022-05-29 NOTE — Patient Instructions (Signed)
Description   Continue taking Warfarin 1/2 tablet daily except 1 tablet on Mondays and Fridays. Recheck INR in 7 weeks. Remain consistent with leafy veggies. Call if you have any concerns or placed on any new medications [phone].

## 2022-05-30 DIAGNOSIS — I4821 Permanent atrial fibrillation: Secondary | ICD-10-CM | POA: Diagnosis not present

## 2022-05-30 DIAGNOSIS — G4733 Obstructive sleep apnea (adult) (pediatric): Secondary | ICD-10-CM | POA: Diagnosis not present

## 2022-06-22 DIAGNOSIS — L298 Other pruritus: Secondary | ICD-10-CM | POA: Diagnosis not present

## 2022-06-22 DIAGNOSIS — L2089 Other atopic dermatitis: Secondary | ICD-10-CM | POA: Diagnosis not present

## 2022-07-17 ENCOUNTER — Ambulatory Visit: Payer: Medicare Other | Attending: Interventional Cardiology | Admitting: *Deleted

## 2022-07-17 DIAGNOSIS — E1140 Type 2 diabetes mellitus with diabetic neuropathy, unspecified: Secondary | ICD-10-CM | POA: Diagnosis not present

## 2022-07-17 DIAGNOSIS — Z5181 Encounter for therapeutic drug level monitoring: Secondary | ICD-10-CM | POA: Diagnosis not present

## 2022-07-17 DIAGNOSIS — D6869 Other thrombophilia: Secondary | ICD-10-CM | POA: Diagnosis not present

## 2022-07-17 DIAGNOSIS — I4821 Permanent atrial fibrillation: Secondary | ICD-10-CM | POA: Diagnosis not present

## 2022-07-17 DIAGNOSIS — E782 Mixed hyperlipidemia: Secondary | ICD-10-CM | POA: Diagnosis not present

## 2022-07-17 DIAGNOSIS — I482 Chronic atrial fibrillation, unspecified: Secondary | ICD-10-CM | POA: Diagnosis not present

## 2022-07-17 LAB — POCT INR: INR: 2.3 (ref 2.0–3.0)

## 2022-07-17 NOTE — Patient Instructions (Signed)
Description   Continue taking Warfarin 1/2 tablet daily except 1 tablet on Mondays and Fridays. Recheck INR in 8 weeks. Remain consistent with leafy veggies. Call if you have any concerns or placed on any new medications [phone].

## 2022-08-03 ENCOUNTER — Other Ambulatory Visit: Payer: Self-pay | Admitting: Interventional Cardiology

## 2022-08-03 DIAGNOSIS — I872 Venous insufficiency (chronic) (peripheral): Secondary | ICD-10-CM | POA: Diagnosis not present

## 2022-08-03 DIAGNOSIS — L2089 Other atopic dermatitis: Secondary | ICD-10-CM | POA: Diagnosis not present

## 2022-08-03 DIAGNOSIS — R233 Spontaneous ecchymoses: Secondary | ICD-10-CM | POA: Diagnosis not present

## 2022-08-15 ENCOUNTER — Ambulatory Visit: Payer: Medicare Other | Attending: Interventional Cardiology | Admitting: Interventional Cardiology

## 2022-08-15 ENCOUNTER — Encounter: Payer: Self-pay | Admitting: Interventional Cardiology

## 2022-08-15 VITALS — BP 110/62 | HR 63 | Ht 69.0 in | Wt 207.4 lb

## 2022-08-15 DIAGNOSIS — E118 Type 2 diabetes mellitus with unspecified complications: Secondary | ICD-10-CM

## 2022-08-15 DIAGNOSIS — G4733 Obstructive sleep apnea (adult) (pediatric): Secondary | ICD-10-CM

## 2022-08-15 DIAGNOSIS — I4821 Permanent atrial fibrillation: Secondary | ICD-10-CM

## 2022-08-15 DIAGNOSIS — E782 Mixed hyperlipidemia: Secondary | ICD-10-CM

## 2022-08-15 DIAGNOSIS — I359 Nonrheumatic aortic valve disorder, unspecified: Secondary | ICD-10-CM

## 2022-08-15 DIAGNOSIS — D6869 Other thrombophilia: Secondary | ICD-10-CM

## 2022-08-15 DIAGNOSIS — Z7984 Long term (current) use of oral hypoglycemic drugs: Secondary | ICD-10-CM

## 2022-08-15 NOTE — Patient Instructions (Signed)
Medication Instructions:  Your physician recommends that you continue on your current medications as directed. Please refer to the Current Medication list given to you today.  *If you need a refill on your cardiac medications before your next appointment, please call your pharmacy*   Lab Work: none If you have labs (blood work) drawn today and your tests are completely normal, you will receive your results only by: MyChart Message (if you have MyChart) OR A paper copy in the mail If you have any lab test that is abnormal or we need to change your treatment, we will call you to review the results.   Testing/Procedures: none   Follow-Up: At [facility], you and your health needs are our priority.  As part of our continuing mission to provide you with exceptional heart care, we have created designated Provider Care Teams.  These Care Teams include your primary Cardiologist (physician) and Advanced Practice Providers (APPs -  Physician Assistants and Nurse Practitioners) who all work together to provide you with the care you need, when you need it.  We recommend signing up for the patient portal called "MyChart".  Sign up information is provided on this After Visit Summary.  MyChart is used to connect with patients for Virtual Visits (Telemedicine).  Patients are able to view lab/test results, encounter notes, upcoming appointments, etc.  Non-urgent messages can be sent to your provider as well.   To learn more about what you can do with MyChart, go to [email or web address].    Your next appointment:   12 month(s)  Provider:   Jayadeep Varanasi, MD     Other Instructions    

## 2022-08-15 NOTE — Progress Notes (Signed)
Cardiology Office Note   Date:  08/15/2022   ID:  Travis May, DOB January 10, 1933, MRN [id number]  PCP:  Georgann Housekeeper, MD    No chief complaint on file.  Atrial fibrillation  Wt Readings from Last 3 Encounters:  08/15/22 207 lb 6.4 oz (94.1 kg)  05/10/21 220 lb 6.4 oz (100 kg)  10/28/19 (!) 224 lb 3.2 oz (101.7 kg)       History of Present Illness: Travis May is a 89 y.o. male with a history of atrial fibrillation, hyperlipidemia and sleep apnea.  He has been managed with a strategy of rate control.  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  This was before Covid.  He would get his blood pressure checked there.   In the past, hip pain and knee pain have limited his exercise along with foot pain.  He has had some visual issues in the past.  He felt like the elliptical bothered his hips and knees.   Still exercising.  Does upper body weights.  Denies : Chest pain. Dizziness. Leg edema. Nitroglycerin use. Orthopnea. Palpitations. Paroxysmal nocturnal dyspnea. Shortness of breath. Syncope.    Past Medical History:  Diagnosis Date   Arthritis    Coronary artery disease    Dysrhythmia    Atrial Fibrillation   Sleep apnea    CPAP    Past Surgical History:  Procedure Laterality Date   BACK SURGERY  1971   herniated disc   COLONOSCOPY WITH PROPOFOL N/A 09/03/2012   Procedure: COLONOSCOPY WITH PROPOFOL;  Surgeon: Charolett Bumpers, MD;  Location: [facility];  Service: Endoscopy;  Laterality: N/A;   EYE SURGERY  1980's   bilateral cataract with lens implants   HERNIA REPAIR  1950,1980   right inguinal x3     Current Outpatient Medications  Medication Sig Dispense Refill   b complex vitamins tablet Take 1 tablet by mouth daily.     cephALEXin (KEFLEX) 500 MG capsule Take 500 mg by mouth 3 (three) times daily.     cholecalciferol (VITAMIN D) 1000 UNITS tablet Take 1,000 Units by mouth daily before  breakfast.     diltiazem (DILACOR XR) 240 MG 24 hr capsule TAKE 1 CAPSULE(240 MG) BY MOUTH DAILY ON AN EMPTY STOMACH 90 capsule 0   GLUCOSAMINE-CHONDROITIN PO Take 1,200 mg by mouth 2 (two) times daily. 1200-1400mg  tablet     latanoprost (XALATAN) 0.005 % ophthalmic solution Place 1 drop into both eyes at bedtime.  10   Magnesium 250 MG TABS Take 1 tablet by mouth daily.     metFORMIN (GLUCOPHAGE) 500 MG tablet Take 1 tablet by mouth daily.     simvastatin (ZOCOR) 10 MG tablet TAKE 1 TABLET BY MOUTH DAILY BEFORE BREAKFAST 90 tablet 0   sodium chloride (OCEAN) 0.65 % nasal spray Place 1 spray into the nose daily as needed for congestion.     triamcinolone cream (KENALOG) 0.1 % Apply topically 2 (two) times daily as needed.     Turmeric 450 MG CAPS Take by mouth daily.     warfarin (COUMADIN) 5 MG tablet TAKE 1/2 TO 1 TABLET BY MOUTH AS DIRECTED BY COUMADIN CLINIC 75 tablet 1   No current facility-administered medications for this visit.    Allergies:   Elemental sulfur and Sulfa antibiotics    Social History:  The patient  reports that he quit smoking about 59 years ago. His smoking use included  cigarettes. He has never used smokeless tobacco. He reports current alcohol use of about 1.0 - 2.0 standard drink of alcohol per week. He reports that he does not use drugs.   Family History:  The patient's family history includes Lung cancer in his father.    ROS:  Please see the history of present illness.   Otherwise, review of systems are positive for intentional weight loss.   All other systems are reviewed and negative.    PHYSICAL EXAM: VS:  BP 110/62   Pulse 63   Ht 5\' 9"  (1.753 m)   Wt 207 lb 6.4 oz (94.1 kg)   SpO2 96%   BMI 30.63 kg/m  , BMI Body mass index is 30.63 kg/m. GEN: Well nourished, well developed, in no acute distress HEENT: normal Neck: no JVD, carotid bruits, or masses Cardiac: irregularly irregular; no murmurs, rubs, or gallops,no edema  Respiratory:  clear to  auscultation bilaterally, normal work of breathing GI: soft, nontender, nondistended, + BS MS: no deformity or atrophy Skin: warm and dry, no rash Neuro: Slow gait with a cane Psych: euthymic mood, full affect   EKG:   The ekg ordered today demonstrates AFib, rate controlled.    Recent Labs: No results found for requested labs within last 365 days.   Lipid Panel No results found for: "CHOL", "TRIG", "HDL", "CHOLHDL", "VLDL", "LDLCALC", "LDLDIRECT"   Other studies Reviewed: Additional studies/ records that were reviewed today with results demonstrating: A1C 6.2 in 2/24.  LDL 91 in 2023.     ASSESSMENT AND PLAN:  Atrial fibrillation: Diltiazem for rate control.  No sx.  Still exercising.  Will compensated. Mild aortic insufficiency: Noted in 2014.  No CHF.  Hyperlipidemia: Healthy diet.  Continue simvastatin.  Anticoagulated: Coumadin for stroke prevention.  No bleeding problems. Diabetes: Whole food, plant-based diet.  High-fiber diet.  Avoid processed foods. Obstructive sleep apnea: Uses CPAP.  Tolerates it well.    Current medicines are reviewed at length with the patient today.  The patient concerns regarding his medicines were addressed.  The following changes have been made:  No change  Labs/ tests ordered today include:  No orders of the defined types were placed in this encounter.   Recommend 150 minutes/week of aerobic exercise Low fat, low carb, high fiber diet recommended  Disposition:   FU in 1 year   Signed, Lance Muss, MD  08/15/2022 3:27 PM    [facility] [address] [place], [place], [place]  [address] Phone: [phone]; Fax: [phone]

## 2022-08-16 NOTE — Addendum Note (Signed)
Addended by: Lacy Duverney R on: 08/16/2022 03:51 PM   Modules accepted: Orders

## 2022-08-23 DIAGNOSIS — H4042X1 Glaucoma secondary to eye inflammation, left eye, mild stage: Secondary | ICD-10-CM | POA: Diagnosis not present

## 2022-08-23 DIAGNOSIS — H43811 Vitreous degeneration, right eye: Secondary | ICD-10-CM | POA: Diagnosis not present

## 2022-08-23 DIAGNOSIS — T8579XS Infection and inflammatory reaction due to other internal prosthetic devices, implants and grafts, sequela: Secondary | ICD-10-CM | POA: Diagnosis not present

## 2022-08-23 DIAGNOSIS — Z961 Presence of intraocular lens: Secondary | ICD-10-CM | POA: Diagnosis not present

## 2022-08-31 DIAGNOSIS — G4733 Obstructive sleep apnea (adult) (pediatric): Secondary | ICD-10-CM | POA: Diagnosis not present

## 2022-08-31 DIAGNOSIS — I4821 Permanent atrial fibrillation: Secondary | ICD-10-CM | POA: Diagnosis not present

## 2022-09-11 ENCOUNTER — Ambulatory Visit: Payer: Medicare Other | Attending: Internal Medicine

## 2022-09-11 DIAGNOSIS — Z5181 Encounter for therapeutic drug level monitoring: Secondary | ICD-10-CM | POA: Diagnosis not present

## 2022-09-11 LAB — POCT INR: INR: 2.1 (ref 2.0–3.0)

## 2022-09-11 NOTE — Patient Instructions (Signed)
Continue taking Warfarin 1/2 tablet daily except 1 tablet on Mondays and Fridays. Recheck INR in 8 weeks. Remain consistent with leafy veggies. Call if you have any concerns or placed on any new medications [phone].

## 2022-09-12 DIAGNOSIS — L2089 Other atopic dermatitis: Secondary | ICD-10-CM | POA: Diagnosis not present

## 2022-09-20 DIAGNOSIS — G4733 Obstructive sleep apnea (adult) (pediatric): Secondary | ICD-10-CM | POA: Diagnosis not present

## 2022-09-22 ENCOUNTER — Other Ambulatory Visit: Payer: Self-pay | Admitting: Interventional Cardiology

## 2022-10-02 ENCOUNTER — Other Ambulatory Visit: Payer: Self-pay | Admitting: Interventional Cardiology

## 2022-10-02 DIAGNOSIS — I4891 Unspecified atrial fibrillation: Secondary | ICD-10-CM

## 2022-10-20 DIAGNOSIS — G4733 Obstructive sleep apnea (adult) (pediatric): Secondary | ICD-10-CM | POA: Diagnosis not present

## 2022-11-06 ENCOUNTER — Ambulatory Visit: Payer: Medicare Other | Attending: Cardiovascular Disease

## 2022-11-06 DIAGNOSIS — Z5181 Encounter for therapeutic drug level monitoring: Secondary | ICD-10-CM

## 2022-11-06 LAB — POCT INR: INR: 2.2 (ref 2.0–3.0)

## 2022-11-06 NOTE — Patient Instructions (Signed)
Continue taking Warfarin 1/2 tablet daily except 1 tablet on Mondays and Fridays. Recheck INR in 8 weeks. Remain consistent with leafy veggies. Call if you have any concerns or placed on any new medications [phone].

## 2022-11-10 ENCOUNTER — Ambulatory Visit: Payer: BLUE CROSS/BLUE SHIELD | Admitting: Interventional Cardiology

## 2022-11-14 ENCOUNTER — Other Ambulatory Visit: Payer: Self-pay | Admitting: Interventional Cardiology

## 2022-11-20 DIAGNOSIS — G4733 Obstructive sleep apnea (adult) (pediatric): Secondary | ICD-10-CM | POA: Diagnosis not present

## 2022-12-13 DIAGNOSIS — L821 Other seborrheic keratosis: Secondary | ICD-10-CM | POA: Diagnosis not present

## 2022-12-13 DIAGNOSIS — L814 Other melanin hyperpigmentation: Secondary | ICD-10-CM | POA: Diagnosis not present

## 2022-12-13 DIAGNOSIS — L2089 Other atopic dermatitis: Secondary | ICD-10-CM | POA: Diagnosis not present

## 2022-12-13 DIAGNOSIS — D225 Melanocytic nevi of trunk: Secondary | ICD-10-CM | POA: Diagnosis not present

## 2023-01-01 ENCOUNTER — Ambulatory Visit: Payer: Medicare Other | Attending: Internal Medicine | Admitting: *Deleted

## 2023-01-01 DIAGNOSIS — Z5181 Encounter for therapeutic drug level monitoring: Secondary | ICD-10-CM | POA: Diagnosis not present

## 2023-01-01 DIAGNOSIS — I4821 Permanent atrial fibrillation: Secondary | ICD-10-CM | POA: Diagnosis not present

## 2023-01-01 LAB — POCT INR: INR: 3 (ref 2.0–3.0)

## 2023-01-01 NOTE — Patient Instructions (Signed)
Description   Continue taking Warfarin 1/2 tablet daily except 1 tablet on Mondays and Fridays. Recheck INR in 8 weeks. Remain consistent with leafy veggies. Call if you have any concerns or placed on any new medications [phone].

## 2023-01-24 DIAGNOSIS — Z0000 Encounter for general adult medical examination without abnormal findings: Secondary | ICD-10-CM | POA: Diagnosis not present

## 2023-01-24 DIAGNOSIS — I4821 Permanent atrial fibrillation: Secondary | ICD-10-CM | POA: Diagnosis not present

## 2023-01-24 DIAGNOSIS — E1140 Type 2 diabetes mellitus with diabetic neuropathy, unspecified: Secondary | ICD-10-CM | POA: Diagnosis not present

## 2023-01-24 DIAGNOSIS — E782 Mixed hyperlipidemia: Secondary | ICD-10-CM | POA: Diagnosis not present

## 2023-01-24 DIAGNOSIS — D6869 Other thrombophilia: Secondary | ICD-10-CM | POA: Diagnosis not present

## 2023-01-24 DIAGNOSIS — R7303 Prediabetes: Secondary | ICD-10-CM | POA: Diagnosis not present

## 2023-01-24 DIAGNOSIS — M179 Osteoarthritis of knee, unspecified: Secondary | ICD-10-CM | POA: Diagnosis not present

## 2023-01-24 DIAGNOSIS — Z23 Encounter for immunization: Secondary | ICD-10-CM | POA: Diagnosis not present

## 2023-02-19 DIAGNOSIS — H43811 Vitreous degeneration, right eye: Secondary | ICD-10-CM | POA: Diagnosis not present

## 2023-02-19 DIAGNOSIS — H04123 Dry eye syndrome of bilateral lacrimal glands: Secondary | ICD-10-CM | POA: Diagnosis not present

## 2023-02-19 DIAGNOSIS — H4042X1 Glaucoma secondary to eye inflammation, left eye, mild stage: Secondary | ICD-10-CM | POA: Diagnosis not present

## 2023-02-19 DIAGNOSIS — T8579XS Infection and inflammatory reaction due to other internal prosthetic devices, implants and grafts, sequela: Secondary | ICD-10-CM | POA: Diagnosis not present

## 2023-02-26 ENCOUNTER — Ambulatory Visit: Payer: Medicare Other | Attending: Cardiology | Admitting: *Deleted

## 2023-02-26 DIAGNOSIS — I4821 Permanent atrial fibrillation: Secondary | ICD-10-CM

## 2023-02-26 DIAGNOSIS — Z5181 Encounter for therapeutic drug level monitoring: Secondary | ICD-10-CM

## 2023-02-26 LAB — POCT INR: INR: 3.3 — AB (ref 2.0–3.0)

## 2023-02-26 NOTE — Patient Instructions (Addendum)
Description   Today take 1/2 tablet of warfarin then continue taking Warfarin 1/2 tablet daily except 1 tablet on Mondays and Fridays. Recheck INR in 6 weeks (normally 8 weeks). Remain consistent with leafy veggies. Call if you have any concerns or placed on any new medications [phone].

## 2023-03-05 DIAGNOSIS — M170 Bilateral primary osteoarthritis of knee: Secondary | ICD-10-CM | POA: Diagnosis not present

## 2023-03-12 DIAGNOSIS — M170 Bilateral primary osteoarthritis of knee: Secondary | ICD-10-CM | POA: Diagnosis not present

## 2023-03-19 DIAGNOSIS — M170 Bilateral primary osteoarthritis of knee: Secondary | ICD-10-CM | POA: Diagnosis not present

## 2023-03-26 DIAGNOSIS — M170 Bilateral primary osteoarthritis of knee: Secondary | ICD-10-CM | POA: Diagnosis not present

## 2023-04-09 ENCOUNTER — Ambulatory Visit: Payer: Medicare Other

## 2023-04-12 ENCOUNTER — Ambulatory Visit: Payer: Medicare Other | Attending: Interventional Cardiology | Admitting: *Deleted

## 2023-04-12 DIAGNOSIS — Z5181 Encounter for therapeutic drug level monitoring: Secondary | ICD-10-CM

## 2023-04-12 DIAGNOSIS — I4821 Permanent atrial fibrillation: Secondary | ICD-10-CM

## 2023-04-12 LAB — POCT INR: INR: 2.5 (ref 2.0–3.0)

## 2023-04-12 NOTE — Patient Instructions (Signed)
Description   Continue taking Warfarin 1/2 tablet daily except 1 tablet on Mondays and Fridays. Recheck INR in 8 weeks. Remain consistent with leafy veggies. Call if you have any concerns or placed on any new medications [phone].

## 2023-04-27 DIAGNOSIS — G4733 Obstructive sleep apnea (adult) (pediatric): Secondary | ICD-10-CM | POA: Diagnosis not present

## 2023-05-08 NOTE — Progress Notes (Signed)
PUO

## 2023-05-27 ENCOUNTER — Other Ambulatory Visit: Payer: Self-pay | Admitting: Interventional Cardiology

## 2023-05-27 DIAGNOSIS — I4891 Unspecified atrial fibrillation: Secondary | ICD-10-CM

## 2023-05-28 DIAGNOSIS — G4733 Obstructive sleep apnea (adult) (pediatric): Secondary | ICD-10-CM | POA: Diagnosis not present

## 2023-06-04 ENCOUNTER — Ambulatory Visit: Payer: Medicare Other | Attending: Interventional Cardiology

## 2023-06-04 DIAGNOSIS — Z5181 Encounter for therapeutic drug level monitoring: Secondary | ICD-10-CM | POA: Diagnosis not present

## 2023-06-04 LAB — POCT INR: INR: 2.8 (ref 2.0–3.0)

## 2023-06-04 NOTE — Patient Instructions (Addendum)
Continue taking Warfarin 1/2 tablet daily except 1 tablet on Mondays and Fridays. Recheck INR in 8 weeks. Remain consistent with leafy veggies. Call if you have any concerns or placed on any new medications [phone].

## 2023-06-18 DIAGNOSIS — D225 Melanocytic nevi of trunk: Secondary | ICD-10-CM | POA: Diagnosis not present

## 2023-06-18 DIAGNOSIS — L821 Other seborrheic keratosis: Secondary | ICD-10-CM | POA: Diagnosis not present

## 2023-06-18 DIAGNOSIS — L814 Other melanin hyperpigmentation: Secondary | ICD-10-CM | POA: Diagnosis not present

## 2023-06-18 DIAGNOSIS — L2089 Other atopic dermatitis: Secondary | ICD-10-CM | POA: Diagnosis not present

## 2023-06-25 DIAGNOSIS — G4733 Obstructive sleep apnea (adult) (pediatric): Secondary | ICD-10-CM | POA: Diagnosis not present

## 2023-06-28 DIAGNOSIS — H5213 Myopia, bilateral: Secondary | ICD-10-CM | POA: Diagnosis not present

## 2023-06-28 DIAGNOSIS — H401131 Primary open-angle glaucoma, bilateral, mild stage: Secondary | ICD-10-CM | POA: Diagnosis not present

## 2023-07-04 ENCOUNTER — Ambulatory Visit: Attending: Cardiovascular Disease | Admitting: Cardiovascular Disease

## 2023-07-04 ENCOUNTER — Encounter: Payer: Self-pay | Admitting: Cardiovascular Disease

## 2023-07-04 VITALS — BP 120/60 | HR 61 | Ht 70.0 in | Wt 213.8 lb

## 2023-07-04 DIAGNOSIS — E782 Mixed hyperlipidemia: Secondary | ICD-10-CM

## 2023-07-04 DIAGNOSIS — I4821 Permanent atrial fibrillation: Secondary | ICD-10-CM

## 2023-07-04 NOTE — Assessment & Plan Note (Signed)
 History of mixed hyperlipidemia on statin therapy with lipid profile performed 01/24/2023 revealing total cholesterol 148, LDL 79 and HDL 47.

## 2023-07-04 NOTE — Assessment & Plan Note (Signed)
 History of persistent A-fib rate controlled on warfarin or oral anticoagulation managed in our Coumadin clinic.

## 2023-07-04 NOTE — Progress Notes (Signed)
 07/04/2023 [name]   [birth year]  [id number]  Primary Physician Travis Housekeeper, MD Primary Cardiologist: Travis Gess MD Travis May, [place]  HPI:  Travis May is a 89 y.o. moderately overweight married Caucasian male father of 2 sons, grandfather of one 53 year old grandson whose wife Travis May is also a patient of mine.  He was formally a patient of Dr. Hoyle Barr.  I am assuming his care.  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  His risk factors are notable for treated hypertension, diabetes and hyperlipidemia.  There is no family history for heart disease Travis May is never had a heart attack or stroke.  He denies chest pain or shortness of breath.  He was an active runner in his younger years but now goes to the "[facility] program" at [facility] [place] where he is been for 16 years exercising 3 days a week.  He denies chest pain or shortness of breath.   Current Meds  Medication Sig   b complex vitamins tablet Take 1 tablet by mouth daily.   cholecalciferol (VITAMIN D) 1000 UNITS tablet Take 2,000 Units by mouth daily before breakfast.   diltiazem (DILT-XR) 240 MG 24 hr capsule TAKE 1 CAPSULE(240 MG) BY MOUTH DAILY ON AN EMPTY STOMACH   GLUCOSAMINE-CHONDROITIN PO Take 1,200 mg by mouth 2 (two) times daily. 1200-1400mg  tablet   latanoprost (XALATAN) 0.005 % ophthalmic solution Place 1 drop into both eyes at bedtime.   Magnesium 250 MG TABS Take 1 tablet by mouth daily.   metFORMIN (GLUCOPHAGE) 500 MG tablet Take 1 tablet by mouth daily.   simvastatin (ZOCOR) 10 MG tablet TAKE 1 TABLET BY MOUTH DAILY BEFORE BREAKFAST   sodium chloride (OCEAN) 0.65 % nasal spray Place 1 spray into the nose daily as needed for congestion.   triamcinolone cream (KENALOG) 0.1 % Apply topically 2 (two) times daily as needed.   Turmeric 450 MG CAPS Take by mouth daily.   warfarin (COUMADIN) 5 MG tablet TAKE 1/2 TO 1 TABLET BY MOUTH AS DIRECTED BY  COUMADIN CLINIC     Allergies  Allergen Reactions   Elemental Sulfur     whelps   Sulfa Antibiotics Rash    35 years ago 35 years ago    Social History   Socioeconomic History   Marital status: Married    Spouse name: Not on file   Number of children: Not on file   Years of education: Not on file   Highest education level: Not on file  Occupational History   Not on file  Tobacco Use   Smoking status: Former    Current packs/day: 0.00    Types: Cigarettes    Quit date: 08/13/1963    Years since quitting: 59.9   Smokeless tobacco: Never  Vaping Use   Vaping status: Never Used  Substance and Sexual Activity   Alcohol use: Yes    Alcohol/week: 1.0 - 2.0 standard drink of alcohol    Types: 1 - 2 Cans of beer per week    Comment: daily-1-2 beers   Drug use: No   Sexual activity: Not on file  Other Topics Concern   Not on file  Social History Narrative   Not on file   Social Drivers of Health   Financial Resource Strain: Not on file  Food Insecurity: Not on file  Transportation Needs: Not on file  Physical Activity: Not on file  Stress: Not on  file  Social Connections: Not on file  Intimate Partner Violence: Not on file     Review of Systems: General: negative for chills, fever, night sweats or weight changes.  Cardiovascular: negative for chest pain, dyspnea on exertion, edema, orthopnea, palpitations, paroxysmal nocturnal dyspnea or shortness of breath Dermatological: negative for rash Respiratory: negative for cough or wheezing Urologic: negative for hematuria Abdominal: negative for nausea, vomiting, diarrhea, bright red blood per rectum, melena, or hematemesis Neurologic: negative for visual changes, syncope, or dizziness All other systems reviewed and are otherwise negative except as noted above.    Blood pressure 120/60, pulse 61, height 5\' 10"  (1.778 m), weight 213 lb 12.8 oz (97 kg), SpO2 95%.  General appearance: alert and no distress Neck: no  adenopathy, no carotid bruit, no JVD, supple, symmetrical, trachea midline, and thyroid not enlarged, symmetric, no tenderness/mass/nodules Lungs: clear to auscultation bilaterally Heart: irregularly irregular rhythm Extremities: extremities normal, atraumatic, no cyanosis or edema Pulses: 2+ and symmetric Skin: Skin color, texture, turgor normal. No rashes or lesions Neurologic: Grossly normal  EKG EKG Interpretation Date/Time:  Wednesday July 04 2023 09:01:30 EDT Ventricular Rate:  61 PR Interval:    QRS Duration:  106 QT Interval:  412 QTC Calculation: 414 R Axis:   77  Text Interpretation: Atrial fibrillation Incomplete right bundle branch block Cannot rule out Anterior infarct , age undetermined When compared with ECG of 01-Dec-2003 14:25, Atrial fibrillation has replaced Sinus rhythm Confirmed by Travis May [phone]) on 07/04/2023 9:06:09 AM    ASSESSMENT AND PLAN:   Atrial fibrillation (HCC) History of persistent A-fib rate controlled on warfarin or oral anticoagulation managed in our Coumadin clinic.  Mixed hyperlipidemia History of mixed hyperlipidemia on statin therapy with lipid profile performed 01/24/2023 revealing total cholesterol 148, LDL 79 and HDL 47.     Travis Gess MD [facility], [facility] 07/04/2023 9:14 AM

## 2023-07-04 NOTE — Patient Instructions (Signed)
 Medication Instructions:  NO CHANGES  *If you need a refill on your cardiac medications before your next appointment, please call your pharmacy*  Follow-Up: At [facility], you and your health needs are our priority.  As part of our continuing mission to provide you with exceptional heart care, our providers are all part of one team.  This team includes your primary Cardiologist (physician) and Advanced Practice Providers or APPs (Physician Assistants and Nurse Practitioners) who all work together to provide you with the care you need, when you need it.  Your next appointment:   12 months with Dr. [name]  We recommend signing up for the patient portal called "MyChart".  Sign up information is provided on this After Visit Summary.  MyChart is used to connect with patients for Virtual Visits (Telemedicine).  Patients are able to view lab/test results, encounter notes, upcoming appointments, etc.  Non-urgent messages can be sent to your provider as well.   To learn more about what you can do with MyChart, go to [email or web address].     1st Floor: - Lobby - Registration  - Pharmacy  - Lab - Cafe  2nd Floor: - PV Lab - Diagnostic Testing (echo, CT, nuclear med)  3rd Floor: - Vacant  4th Floor: - TCTS (cardiothoracic surgery) - AFib Clinic - Structural Heart Clinic - Vascular Surgery  - Vascular Ultrasound  5th Floor: - HeartCare Cardiology (general and EP) - Clinical Pharmacy for coumadin, hypertension, lipid, weight-loss medications, and med management appointments    Valet parking services will be available as well.

## 2023-07-30 ENCOUNTER — Ambulatory Visit: Attending: Interventional Cardiology | Admitting: *Deleted

## 2023-07-30 ENCOUNTER — Other Ambulatory Visit: Payer: Self-pay | Admitting: Interventional Cardiology

## 2023-07-30 DIAGNOSIS — I4821 Permanent atrial fibrillation: Secondary | ICD-10-CM

## 2023-07-30 DIAGNOSIS — Z5181 Encounter for therapeutic drug level monitoring: Secondary | ICD-10-CM

## 2023-07-30 LAB — POCT INR: INR: 1.9 — AB (ref 2.0–3.0)

## 2023-07-30 NOTE — Patient Instructions (Addendum)
 Description   Today take 1.5 tablets then continue taking Warfarin 1/2 tablet daily except 1 tablet on Mondays and Fridays. Recheck INR in 6 weeks. Remain consistent with leafy veggies. Call if you have any concerns or placed on any new medications [phone].

## 2023-08-15 DIAGNOSIS — D6869 Other thrombophilia: Secondary | ICD-10-CM | POA: Diagnosis not present

## 2023-08-15 DIAGNOSIS — G4733 Obstructive sleep apnea (adult) (pediatric): Secondary | ICD-10-CM | POA: Diagnosis not present

## 2023-08-15 DIAGNOSIS — E1140 Type 2 diabetes mellitus with diabetic neuropathy, unspecified: Secondary | ICD-10-CM | POA: Diagnosis not present

## 2023-08-15 DIAGNOSIS — I4821 Permanent atrial fibrillation: Secondary | ICD-10-CM | POA: Diagnosis not present

## 2023-08-22 DIAGNOSIS — H04123 Dry eye syndrome of bilateral lacrimal glands: Secondary | ICD-10-CM | POA: Diagnosis not present

## 2023-08-22 DIAGNOSIS — H4042X1 Glaucoma secondary to eye inflammation, left eye, mild stage: Secondary | ICD-10-CM | POA: Diagnosis not present

## 2023-08-22 DIAGNOSIS — Z961 Presence of intraocular lens: Secondary | ICD-10-CM | POA: Diagnosis not present

## 2023-08-22 DIAGNOSIS — T8579XS Infection and inflammatory reaction due to other internal prosthetic devices, implants and grafts, sequela: Secondary | ICD-10-CM | POA: Diagnosis not present

## 2023-09-06 DIAGNOSIS — G4733 Obstructive sleep apnea (adult) (pediatric): Secondary | ICD-10-CM | POA: Diagnosis not present

## 2023-09-10 ENCOUNTER — Ambulatory Visit: Attending: Interventional Cardiology | Admitting: *Deleted

## 2023-09-10 DIAGNOSIS — Z5181 Encounter for therapeutic drug level monitoring: Secondary | ICD-10-CM

## 2023-09-10 DIAGNOSIS — I4821 Permanent atrial fibrillation: Secondary | ICD-10-CM | POA: Diagnosis not present

## 2023-09-10 LAB — POCT INR: INR: 2.8 (ref 2.0–3.0)

## 2023-09-10 NOTE — Patient Instructions (Signed)
 Description   Continue taking Warfarin 1/2 tablet daily except 1 tablet on Mondays and Fridays. Recheck INR in 6 weeks. Remain consistent with leafy veggies. Call if you have any concerns or placed on any new medications [phone].

## 2023-09-12 DIAGNOSIS — H9222 Otorrhagia, left ear: Secondary | ICD-10-CM | POA: Diagnosis not present

## 2023-09-12 DIAGNOSIS — H6122 Impacted cerumen, left ear: Secondary | ICD-10-CM | POA: Diagnosis not present

## 2023-09-22 DIAGNOSIS — G4733 Obstructive sleep apnea (adult) (pediatric): Secondary | ICD-10-CM | POA: Diagnosis not present

## 2023-09-24 ENCOUNTER — Other Ambulatory Visit: Payer: Self-pay | Admitting: Interventional Cardiology

## 2023-10-13 DIAGNOSIS — S80262A Insect bite (nonvenomous), left knee, initial encounter: Secondary | ICD-10-CM | POA: Diagnosis not present

## 2023-10-18 ENCOUNTER — Telehealth: Payer: Self-pay | Admitting: Cardiovascular Disease

## 2023-10-18 DIAGNOSIS — I4891 Unspecified atrial fibrillation: Secondary | ICD-10-CM

## 2023-10-18 MED ORDER — WARFARIN SODIUM 5 MG PO TABS
ORAL_TABLET | ORAL | 0 refills | Status: DC
Start: 2023-10-18 — End: 2024-02-21

## 2023-10-18 NOTE — Telephone Encounter (Signed)
*  STAT* If patient is at the pharmacy, call can be transferred to refill team.   1. Which medications need to be refilled? (please list name of each medication and dose if known)   warfarin (COUMADIN ) 5 MG tablet   2. Which pharmacy/location (including street and city if local pharmacy) is medication to be sent to? [id number] [place], [place] - [address] AT [facility] [address] Phone: [phone]  Fax: [phone]     3. Do they need a 30 day or 90 day supply? 90

## 2023-10-18 NOTE — Telephone Encounter (Signed)
 Prescription refill request received for warfarin Lov: 07/04/23 Junnie)  Next INR check: 10/22/23 Warfarin tablet strength: 5mg   Appropriate dose. Refill sent.

## 2023-10-22 ENCOUNTER — Ambulatory Visit: Attending: Interventional Cardiology | Admitting: *Deleted

## 2023-10-22 DIAGNOSIS — Z5181 Encounter for therapeutic drug level monitoring: Secondary | ICD-10-CM

## 2023-10-22 DIAGNOSIS — G4733 Obstructive sleep apnea (adult) (pediatric): Secondary | ICD-10-CM | POA: Diagnosis not present

## 2023-10-22 DIAGNOSIS — I4821 Permanent atrial fibrillation: Secondary | ICD-10-CM | POA: Diagnosis not present

## 2023-10-22 LAB — POCT INR: INR: 4 — AB (ref 2.0–3.0)

## 2023-10-22 NOTE — Patient Instructions (Signed)
 Description   Do not take any warfarin today then continue taking Warfarin 1/2 tablet daily except 1 tablet on Mondays and Fridays. Recheck INR in 4 weeks. Remain consistent with leafy veggies. Call if you have any concerns or placed on any new medications [phone].

## 2023-10-22 NOTE — Progress Notes (Signed)
 INR 4.0; Please see anticoagulation encounter

## 2023-10-30 ENCOUNTER — Telehealth: Payer: Self-pay | Admitting: Cardiovascular Disease

## 2023-10-30 NOTE — Telephone Encounter (Signed)
*  STAT* If patient is at the pharmacy, call can be transferred to refill team.   1. Which medications need to be refilled? (please list name of each medication and dose if known) new prescription for Diltiazem    2. Would you like to learn more about the convenience, safety, & potential cost savings by using the [facility]?     3. Are you open to using the [facility] (Type [facility].   4. Which pharmacy/location (including street and city if local pharmacy) is medication to be sent to? [facility] [address], [place]   5. Do they need a 30 day or 90 day supply? 90 days and refills

## 2023-10-31 NOTE — Telephone Encounter (Signed)
 Pharmacy has refills on file and will fill for pt

## 2023-11-19 ENCOUNTER — Ambulatory Visit: Attending: Cardiovascular Disease

## 2023-11-19 DIAGNOSIS — Z5181 Encounter for therapeutic drug level monitoring: Secondary | ICD-10-CM | POA: Diagnosis not present

## 2023-11-19 LAB — POCT INR: INR: 3 (ref 2.0–3.0)

## 2023-11-19 NOTE — Progress Notes (Signed)
 INR 3.0; Please see anticoagulation encounter

## 2023-11-19 NOTE — Patient Instructions (Signed)
 continue taking Warfarin 1/2 tablet daily except 1 tablet on Mondays and Fridays. Recheck INR in 6 weeks. Remain consistent with leafy veggies. Call if you have any concerns or placed on any new medications [phone]

## 2023-11-22 DIAGNOSIS — G4733 Obstructive sleep apnea (adult) (pediatric): Secondary | ICD-10-CM | POA: Diagnosis not present

## 2023-12-25 DIAGNOSIS — L603 Nail dystrophy: Secondary | ICD-10-CM | POA: Diagnosis not present

## 2023-12-25 DIAGNOSIS — L570 Actinic keratosis: Secondary | ICD-10-CM | POA: Diagnosis not present

## 2023-12-25 DIAGNOSIS — D1801 Hemangioma of skin and subcutaneous tissue: Secondary | ICD-10-CM | POA: Diagnosis not present

## 2023-12-25 DIAGNOSIS — L2089 Other atopic dermatitis: Secondary | ICD-10-CM | POA: Diagnosis not present

## 2023-12-31 ENCOUNTER — Ambulatory Visit: Attending: Interventional Cardiology | Admitting: *Deleted

## 2023-12-31 DIAGNOSIS — I4821 Permanent atrial fibrillation: Secondary | ICD-10-CM

## 2023-12-31 DIAGNOSIS — Z5181 Encounter for therapeutic drug level monitoring: Secondary | ICD-10-CM | POA: Diagnosis not present

## 2023-12-31 LAB — POCT INR: INR: 2.3 (ref 2.0–3.0)

## 2023-12-31 NOTE — Progress Notes (Signed)
 Description   INR-2.3; Continue taking Warfarin 1/2 tablet daily except 1 tablet on Mondays and Fridays. Recheck INR in 6 weeks. Remain consistent with leafy veggies. Call if you have any concerns or placed on any new medications [phone].

## 2023-12-31 NOTE — Patient Instructions (Signed)
 Description   INR-2.3; Continue taking Warfarin 1/2 tablet daily except 1 tablet on Mondays and Fridays. Recheck INR in 6 weeks. Remain consistent with leafy veggies. Call if you have any concerns or placed on any new medications [phone].

## 2024-02-01 DIAGNOSIS — Z23 Encounter for immunization: Secondary | ICD-10-CM | POA: Diagnosis not present

## 2024-02-01 DIAGNOSIS — E782 Mixed hyperlipidemia: Secondary | ICD-10-CM | POA: Diagnosis not present

## 2024-02-01 DIAGNOSIS — E1140 Type 2 diabetes mellitus with diabetic neuropathy, unspecified: Secondary | ICD-10-CM | POA: Diagnosis not present

## 2024-02-01 DIAGNOSIS — D6869 Other thrombophilia: Secondary | ICD-10-CM | POA: Diagnosis not present

## 2024-02-01 DIAGNOSIS — I4821 Permanent atrial fibrillation: Secondary | ICD-10-CM | POA: Diagnosis not present

## 2024-02-01 DIAGNOSIS — G4733 Obstructive sleep apnea (adult) (pediatric): Secondary | ICD-10-CM | POA: Diagnosis not present

## 2024-02-11 ENCOUNTER — Ambulatory Visit: Attending: Interventional Cardiology | Admitting: Pharmacist

## 2024-02-11 DIAGNOSIS — H04123 Dry eye syndrome of bilateral lacrimal glands: Secondary | ICD-10-CM | POA: Diagnosis not present

## 2024-02-11 DIAGNOSIS — Z961 Presence of intraocular lens: Secondary | ICD-10-CM | POA: Diagnosis not present

## 2024-02-11 DIAGNOSIS — H4042X1 Glaucoma secondary to eye inflammation, left eye, mild stage: Secondary | ICD-10-CM | POA: Diagnosis not present

## 2024-02-11 DIAGNOSIS — Z5181 Encounter for therapeutic drug level monitoring: Secondary | ICD-10-CM | POA: Diagnosis not present

## 2024-02-11 DIAGNOSIS — I4821 Permanent atrial fibrillation: Secondary | ICD-10-CM | POA: Diagnosis not present

## 2024-02-11 DIAGNOSIS — T8579XS Infection and inflammatory reaction due to other internal prosthetic devices, implants and grafts, sequela: Secondary | ICD-10-CM | POA: Diagnosis not present

## 2024-02-11 LAB — POCT INR: INR: 2 (ref 2.0–3.0)

## 2024-02-11 NOTE — Patient Instructions (Signed)
 Description   INR-2.0; Continue taking Warfarin 1/2 tablet daily except 1 tablet on Mondays and Fridays. Recheck INR in 6 weeks. Remain consistent with leafy veggies. Call if you have any concerns or placed on any new medications [phone].

## 2024-02-11 NOTE — Progress Notes (Signed)
 Description   INR-2.0; Continue taking Warfarin 1/2 tablet daily except 1 tablet on Mondays and Fridays. Recheck INR in 6 weeks. Remain consistent with leafy veggies. Call if you have any concerns or placed on any new medications [phone].

## 2024-02-21 ENCOUNTER — Other Ambulatory Visit: Payer: Self-pay | Admitting: Cardiovascular Disease

## 2024-02-21 DIAGNOSIS — I4891 Unspecified atrial fibrillation: Secondary | ICD-10-CM

## 2024-02-27 ENCOUNTER — Other Ambulatory Visit: Payer: Self-pay | Admitting: *Deleted

## 2024-02-27 DIAGNOSIS — I4891 Unspecified atrial fibrillation: Secondary | ICD-10-CM

## 2024-02-27 MED ORDER — WARFARIN SODIUM 5 MG PO TABS: ORAL_TABLET | ORAL | 1 refills | Status: AC

## 2024-02-27 NOTE — Telephone Encounter (Signed)
 Warfarin 5g refill  Afib Last INR 02/11/24 Last OV 07/04/23

## 2024-03-24 ENCOUNTER — Ambulatory Visit: Attending: Interventional Cardiology | Admitting: *Deleted

## 2024-03-24 DIAGNOSIS — I4821 Permanent atrial fibrillation: Secondary | ICD-10-CM | POA: Diagnosis not present

## 2024-03-24 DIAGNOSIS — Z5181 Encounter for therapeutic drug level monitoring: Secondary | ICD-10-CM | POA: Diagnosis not present

## 2024-03-24 DIAGNOSIS — L570 Actinic keratosis: Secondary | ICD-10-CM | POA: Diagnosis not present

## 2024-03-24 DIAGNOSIS — L2089 Other atopic dermatitis: Secondary | ICD-10-CM | POA: Diagnosis not present

## 2024-03-24 LAB — POCT INR: INR: 2.2 (ref 2.0–3.0)

## 2024-03-24 NOTE — Progress Notes (Signed)
 Description   INR-2.2; Continue taking Warfarin 1/2 tablet daily except 1 tablet on Mondays and Fridays. Recheck INR in 6 weeks. Remain consistent with leafy veggies. Call if you have any concerns or placed on any new medications [phone].

## 2024-03-24 NOTE — Patient Instructions (Signed)
 Description   INR-2.2; Continue taking Warfarin 1/2 tablet daily except 1 tablet on Mondays and Fridays. Recheck INR in 6 weeks. Remain consistent with leafy veggies. Call if you have any concerns or placed on any new medications [phone].

## 2024-05-04 ENCOUNTER — Telehealth: Payer: Self-pay | Admitting: *Deleted

## 2024-05-04 NOTE — Telephone Encounter (Signed)
 Left message for the patient to call back regarding the office opening at 10am tomorrow due to inclement weather and asked to call back if able to come later.

## 2024-05-05 ENCOUNTER — Ambulatory Visit

## 2024-05-05 ENCOUNTER — Telehealth: Payer: Self-pay | Admitting: *Deleted

## 2024-05-05 NOTE — Telephone Encounter (Signed)
Left message for the patient to call back to reschedule missed appointment.

## 2024-05-26 ENCOUNTER — Ambulatory Visit
